# Patient Record
Sex: Female | Born: 1935 | Race: White | Marital: Married | Smoking: Never smoker
Health system: Southern US, Community
[De-identification: ages and names within clinical notes are randomized; demographics above are authoritative.]

## PROBLEM LIST (undated history)

## (undated) DIAGNOSIS — F0390 Unspecified dementia without behavioral disturbance: Secondary | ICD-10-CM

## (undated) DIAGNOSIS — E079 Disorder of thyroid, unspecified: Secondary | ICD-10-CM

---

## 2015-02-25 ENCOUNTER — Other Ambulatory Visit: Payer: Self-pay | Admitting: Family Medicine

## 2015-02-25 DIAGNOSIS — R413 Other amnesia: Secondary | ICD-10-CM

## 2015-02-25 DIAGNOSIS — I10 Essential (primary) hypertension: Secondary | ICD-10-CM

## 2015-03-02 ENCOUNTER — Other Ambulatory Visit: Payer: Self-pay

## 2015-03-09 ENCOUNTER — Other Ambulatory Visit: Payer: Self-pay | Admitting: Family Medicine

## 2015-03-09 ENCOUNTER — Ambulatory Visit
Admission: RE | Admit: 2015-03-09 | Discharge: 2015-03-09 | Disposition: A | Discharge status: Home / Self Care | Payer: Medicare Other | Source: Ambulatory Visit | Attending: Family Medicine | Admitting: Family Medicine

## 2015-03-09 DIAGNOSIS — R413 Other amnesia: Secondary | ICD-10-CM

## 2015-03-09 DIAGNOSIS — I10 Essential (primary) hypertension: Secondary | ICD-10-CM

## 2015-08-10 ENCOUNTER — Other Ambulatory Visit: Payer: Self-pay | Admitting: Family Medicine

## 2015-08-10 DIAGNOSIS — R109 Unspecified abdominal pain: Secondary | ICD-10-CM

## 2015-08-10 DIAGNOSIS — R9389 Abnormal findings on diagnostic imaging of other specified body structures: Secondary | ICD-10-CM

## 2015-08-11 ENCOUNTER — Inpatient Hospital Stay (HOSPITAL_COMMUNITY)
Admission: EM | Admit: 2015-08-11 | Discharge: 2015-08-14 | DRG: 470 | Disposition: A | Discharge status: Skilled Nursing Facility | Payer: Medicare Other | Attending: Internal Medicine | Admitting: Internal Medicine

## 2015-08-11 ENCOUNTER — Encounter (HOSPITAL_COMMUNITY)
Admission: EM | Disposition: A | Discharge status: Skilled Nursing Facility | Payer: Self-pay | Source: Home / Self Care | Attending: Internal Medicine

## 2015-08-11 ENCOUNTER — Encounter (HOSPITAL_COMMUNITY): Payer: Self-pay | Admitting: *Deleted

## 2015-08-11 ENCOUNTER — Emergency Department (HOSPITAL_COMMUNITY): Payer: Medicare Other

## 2015-08-11 ENCOUNTER — Inpatient Hospital Stay (HOSPITAL_COMMUNITY): Payer: Medicare Other | Admitting: Certified Registered Nurse Anesthetist

## 2015-08-11 ENCOUNTER — Inpatient Hospital Stay (HOSPITAL_COMMUNITY): Payer: Medicare Other

## 2015-08-11 DIAGNOSIS — S72002A Fracture of unspecified part of neck of left femur, initial encounter for closed fracture: Secondary | ICD-10-CM | POA: Diagnosis present

## 2015-08-11 DIAGNOSIS — C7951 Secondary malignant neoplasm of bone: Secondary | ICD-10-CM | POA: Diagnosis present

## 2015-08-11 DIAGNOSIS — S72009A Fracture of unspecified part of neck of unspecified femur, initial encounter for closed fracture: Secondary | ICD-10-CM | POA: Diagnosis present

## 2015-08-11 DIAGNOSIS — S72012A Unspecified intracapsular fracture of left femur, initial encounter for closed fracture: Secondary | ICD-10-CM | POA: Diagnosis present

## 2015-08-11 DIAGNOSIS — N179 Acute kidney failure, unspecified: Secondary | ICD-10-CM | POA: Diagnosis present

## 2015-08-11 DIAGNOSIS — Z515 Encounter for palliative care: Secondary | ICD-10-CM | POA: Insufficient documentation

## 2015-08-11 DIAGNOSIS — T68XXXA Hypothermia, initial encounter: Secondary | ICD-10-CM | POA: Diagnosis present

## 2015-08-11 DIAGNOSIS — E871 Hypo-osmolality and hyponatremia: Secondary | ICD-10-CM | POA: Diagnosis present

## 2015-08-11 DIAGNOSIS — F0390 Unspecified dementia without behavioral disturbance: Secondary | ICD-10-CM | POA: Diagnosis not present

## 2015-08-11 DIAGNOSIS — W010XXA Fall on same level from slipping, tripping and stumbling without subsequent striking against object, initial encounter: Secondary | ICD-10-CM | POA: Diagnosis present

## 2015-08-11 DIAGNOSIS — E039 Hypothyroidism, unspecified: Secondary | ICD-10-CM | POA: Diagnosis present

## 2015-08-11 DIAGNOSIS — I10 Essential (primary) hypertension: Secondary | ICD-10-CM

## 2015-08-11 DIAGNOSIS — C799 Secondary malignant neoplasm of unspecified site: Secondary | ICD-10-CM | POA: Diagnosis present

## 2015-08-11 DIAGNOSIS — I129 Hypertensive chronic kidney disease with stage 1 through stage 4 chronic kidney disease, or unspecified chronic kidney disease: Secondary | ICD-10-CM | POA: Diagnosis present

## 2015-08-11 DIAGNOSIS — N183 Chronic kidney disease, stage 3 (moderate): Secondary | ICD-10-CM | POA: Diagnosis present

## 2015-08-11 DIAGNOSIS — R109 Unspecified abdominal pain: Secondary | ICD-10-CM | POA: Diagnosis present

## 2015-08-11 DIAGNOSIS — Z66 Do not resuscitate: Secondary | ICD-10-CM | POA: Diagnosis present

## 2015-08-11 DIAGNOSIS — N390 Urinary tract infection, site not specified: Secondary | ICD-10-CM | POA: Diagnosis present

## 2015-08-11 DIAGNOSIS — C7801 Secondary malignant neoplasm of right lung: Secondary | ICD-10-CM | POA: Diagnosis present

## 2015-08-11 DIAGNOSIS — C7802 Secondary malignant neoplasm of left lung: Secondary | ICD-10-CM | POA: Diagnosis present

## 2015-08-11 DIAGNOSIS — Y92193 Bedroom in other specified residential institution as the place of occurrence of the external cause: Secondary | ICD-10-CM | POA: Diagnosis not present

## 2015-08-11 DIAGNOSIS — C801 Malignant (primary) neoplasm, unspecified: Secondary | ICD-10-CM | POA: Diagnosis not present

## 2015-08-11 HISTORY — DX: Disorder of thyroid, unspecified: E07.9

## 2015-08-11 HISTORY — PX: ANTERIOR APPROACH HEMI HIP ARTHROPLASTY: SHX6690

## 2015-08-11 HISTORY — DX: Unspecified dementia, unspecified severity, without behavioral disturbance, psychotic disturbance, mood disturbance, and anxiety: F03.90

## 2015-08-11 LAB — CBC WITH DIFFERENTIAL/PLATELET
BASOS PCT: 0 %
Basophils Absolute: 0 10*3/uL (ref 0.0–0.1)
EOS ABS: 0 10*3/uL (ref 0.0–0.7)
Eosinophils Relative: 0 %
HCT: 35.5 % — ABNORMAL LOW (ref 36.0–46.0)
Hemoglobin: 12 g/dL (ref 12.0–15.0)
Lymphocytes Relative: 4 %
Lymphs Abs: 0.4 10*3/uL — ABNORMAL LOW (ref 0.7–4.0)
MCH: 30.2 pg (ref 26.0–34.0)
MCHC: 33.8 g/dL (ref 30.0–36.0)
MCV: 89.4 fL (ref 78.0–100.0)
MONO ABS: 0.6 10*3/uL (ref 0.1–1.0)
MONOS PCT: 6 %
Neutro Abs: 9.4 10*3/uL — ABNORMAL HIGH (ref 1.7–7.7)
Neutrophils Relative %: 90 %
PLATELETS: 327 10*3/uL (ref 150–400)
RBC: 3.97 MIL/uL (ref 3.87–5.11)
RDW: 11.9 % (ref 11.5–15.5)
WBC: 10.4 10*3/uL (ref 4.0–10.5)

## 2015-08-11 LAB — BASIC METABOLIC PANEL
Anion gap: 10 (ref 5–15)
BUN: 42 mg/dL — ABNORMAL HIGH (ref 6–20)
CALCIUM: 9.8 mg/dL (ref 8.9–10.3)
CO2: 21 mmol/L — AB (ref 22–32)
CREATININE: 1.78 mg/dL — AB (ref 0.44–1.00)
Chloride: 99 mmol/L — ABNORMAL LOW (ref 101–111)
GFR, EST AFRICAN AMERICAN: 29 mL/min — AB (ref >60)
GFR, EST NON AFRICAN AMERICAN: 25 mL/min — AB (ref >60)
GLUCOSE: 165 mg/dL — AB (ref 65–99)
Potassium: 4.5 mmol/L (ref 3.5–5.1)
Sodium: 130 mmol/L — ABNORMAL LOW (ref 135–145)

## 2015-08-11 LAB — URINALYSIS, ROUTINE W REFLEX MICROSCOPIC
Bilirubin Urine: NEGATIVE
GLUCOSE, UA: NEGATIVE mg/dL
Ketones, ur: 15 mg/dL — AB
Nitrite: NEGATIVE
PH: 6 (ref 5.0–8.0)
Protein, ur: NEGATIVE mg/dL
SPECIFIC GRAVITY, URINE: 1.014 (ref 1.005–1.030)

## 2015-08-11 LAB — ABO/RH: ABO/RH(D): O POS

## 2015-08-11 LAB — URINE MICROSCOPIC-ADD ON

## 2015-08-11 LAB — PROTIME-INR
INR: 1.11 (ref 0.00–1.49)
PROTHROMBIN TIME: 14.5 s (ref 11.6–15.2)

## 2015-08-11 LAB — TROPONIN I: Troponin I: <0.03 ng/mL (ref <0.031)

## 2015-08-11 LAB — CK: CK TOTAL: 288 U/L — AB (ref 38–234)

## 2015-08-11 LAB — TYPE AND SCREEN
ABO/RH(D): O POS
Antibody Screen: NEGATIVE

## 2015-08-11 LAB — SURGICAL PCR SCREEN
MRSA, PCR: NEGATIVE
STAPHYLOCOCCUS AUREUS: NEGATIVE

## 2015-08-11 SURGERY — Surgical Case
Anesthesia: *Unknown

## 2015-08-11 SURGERY — HEMIARTHROPLASTY, HIP, DIRECT ANTERIOR APPROACH, FOR FRACTURE
Anesthesia: General | Site: Hip | Laterality: Left

## 2015-08-11 MED ORDER — LIDOCAINE HCL (CARDIAC) 20 MG/ML IV SOLN
INTRAVENOUS | Status: AC
  Filled 2015-08-11: qty 5

## 2015-08-11 MED ORDER — SODIUM CHLORIDE 0.9 % IV SOLN
Freq: Once | INTRAVENOUS | Status: AC
  Administered 2015-08-11: 13:00:00 via INTRAVENOUS

## 2015-08-11 MED ORDER — BUPIVACAINE HCL (PF) 0.5 % IJ SOLN
INTRAMUSCULAR | Status: AC
  Filled 2015-08-11: qty 30

## 2015-08-11 MED ORDER — SUCCINYLCHOLINE CHLORIDE 20 MG/ML IJ SOLN
INTRAMUSCULAR | Status: AC
  Filled 2015-08-11: qty 1

## 2015-08-11 MED ORDER — EPHEDRINE SULFATE 50 MG/ML IJ SOLN
INTRAMUSCULAR | Status: DC | PRN
  Administered 2015-08-11: 10 mg via INTRAVENOUS

## 2015-08-11 MED ORDER — LEVOTHYROXINE SODIUM 112 MCG PO TABS
112.0000 ug | ORAL_TABLET | Freq: Every day | ORAL | Status: DC
  Administered 2015-08-12 – 2015-08-14 (×3): 112 ug via ORAL
  Filled 2015-08-11 (×4): qty 1

## 2015-08-11 MED ORDER — MORPHINE SULFATE (PF) 2 MG/ML IV SOLN
0.5000 mg | INTRAVENOUS | Status: DC | PRN
  Administered 2015-08-11: 0.5 mg via INTRAVENOUS
  Filled 2015-08-11: qty 1

## 2015-08-11 MED ORDER — ONDANSETRON HCL 4 MG/2ML IJ SOLN
INTRAMUSCULAR | Status: AC
  Filled 2015-08-11: qty 2

## 2015-08-11 MED ORDER — TRANEXAMIC ACID 1000 MG/10ML IV SOLN
1000.0000 mg | INTRAVENOUS | Status: AC
  Administered 2015-08-11: 1000 mg via INTRAVENOUS
  Filled 2015-08-11: qty 10

## 2015-08-11 MED ORDER — PHENYLEPHRINE HCL 10 MG/ML IJ SOLN
INTRAMUSCULAR | Status: DC | PRN
  Administered 2015-08-11 (×5): 80 ug via INTRAVENOUS

## 2015-08-11 MED ORDER — 0.9 % SODIUM CHLORIDE (POUR BTL) OPTIME
TOPICAL | Status: DC | PRN
  Administered 2015-08-11: 1000 mL

## 2015-08-11 MED ORDER — ONDANSETRON HCL 4 MG/2ML IJ SOLN
INTRAMUSCULAR | Status: DC | PRN
  Administered 2015-08-11: 4 mg via INTRAVENOUS

## 2015-08-11 MED ORDER — LIDOCAINE HCL (CARDIAC) 20 MG/ML IV SOLN
INTRAVENOUS | Status: DC | PRN
  Administered 2015-08-11: 60 mg via INTRAVENOUS

## 2015-08-11 MED ORDER — BUPIVACAINE HCL 0.5 % IJ SOLN
INTRAMUSCULAR | Status: DC | PRN
  Administered 2015-08-11: 20 mL

## 2015-08-11 MED ORDER — CEFAZOLIN SODIUM-DEXTROSE 2-3 GM-% IV SOLR
INTRAVENOUS | Status: DC | PRN
  Administered 2015-08-11: 2 g via INTRAVENOUS

## 2015-08-11 MED ORDER — ROCURONIUM BROMIDE 50 MG/5ML IV SOLN
INTRAVENOUS | Status: AC
  Filled 2015-08-11: qty 1

## 2015-08-11 MED ORDER — DEXAMETHASONE SODIUM PHOSPHATE 4 MG/ML IJ SOLN
INTRAMUSCULAR | Status: DC | PRN
  Administered 2015-08-11: 4 mg via INTRAVENOUS

## 2015-08-11 MED ORDER — DEXAMETHASONE SODIUM PHOSPHATE 4 MG/ML IJ SOLN
INTRAMUSCULAR | Status: AC
  Filled 2015-08-11: qty 1

## 2015-08-11 MED ORDER — TRAMADOL HCL 50 MG PO TABS: 50.0000 mg | ORAL_TABLET | Freq: Four times a day (QID) | ORAL | Status: AC | PRN

## 2015-08-11 MED ORDER — LACTATED RINGERS IV SOLN
INTRAVENOUS | Status: DC | PRN
  Administered 2015-08-11 (×2): via INTRAVENOUS

## 2015-08-11 MED ORDER — BISACODYL 10 MG RE SUPP: 10.0000 mg | Freq: Every day | RECTAL | Status: DC | PRN

## 2015-08-11 MED ORDER — SODIUM CHLORIDE 0.9 % IV SOLN
20.0000 mL | INTRAVENOUS | Status: DC
  Administered 2015-08-11: 1000 mL via INTRAVENOUS

## 2015-08-11 MED ORDER — SUCCINYLCHOLINE CHLORIDE 20 MG/ML IJ SOLN
INTRAMUSCULAR | Status: DC | PRN
  Administered 2015-08-11: 80 mg via INTRAVENOUS

## 2015-08-11 MED ORDER — ONDANSETRON HCL 4 MG/2ML IJ SOLN: 4.0000 mg | Freq: Three times a day (TID) | INTRAMUSCULAR | Status: AC | PRN

## 2015-08-11 MED ORDER — PROPOFOL 10 MG/ML IV BOLUS
INTRAVENOUS | Status: DC | PRN
  Administered 2015-08-11: 100 mg via INTRAVENOUS

## 2015-08-11 MED ORDER — BUPIVACAINE LIPOSOME 1.3 % IJ SUSP
INTRAMUSCULAR | Status: DC | PRN
  Administered 2015-08-11: 20 mL

## 2015-08-11 MED ORDER — SODIUM CHLORIDE 0.9 % IJ SOLN
INTRAMUSCULAR | Status: AC
  Filled 2015-08-11: qty 10

## 2015-08-11 MED ORDER — BUPIVACAINE LIPOSOME 1.3 % IJ SUSP
20.0000 mL | INTRAMUSCULAR | Status: DC
  Filled 2015-08-11: qty 20

## 2015-08-11 MED ORDER — PROPOFOL 10 MG/ML IV BOLUS
INTRAVENOUS | Status: AC
  Filled 2015-08-11: qty 20

## 2015-08-11 MED ORDER — SUFENTANIL CITRATE 50 MCG/ML IV SOLN
INTRAVENOUS | Status: DC | PRN
  Administered 2015-08-11 (×3): 5 ug via INTRAVENOUS

## 2015-08-11 MED ORDER — ASPIRIN EC 325 MG PO TBEC: 325.0000 mg | DELAYED_RELEASE_TABLET | Freq: Two times a day (BID) | ORAL | Status: DC

## 2015-08-11 MED ORDER — NALOXONE HCL 0.4 MG/ML IJ SOLN
INTRAMUSCULAR | Status: AC
  Filled 2015-08-11: qty 1

## 2015-08-11 MED ORDER — HYDROCODONE-ACETAMINOPHEN 5-325 MG PO TABS: 1.0000 | ORAL_TABLET | ORAL | Status: DC | PRN

## 2015-08-11 MED ORDER — SUFENTANIL CITRATE 50 MCG/ML IV SOLN
INTRAVENOUS | Status: AC
  Filled 2015-08-11: qty 1

## 2015-08-11 SURGICAL SUPPLY — 57 items
BENZOIN TINCTURE PRP APPL 2/3 (GAUZE/BANDAGES/DRESSINGS) ×2 IMPLANT
BLADE SAW SGTL 18X1.27X75 (BLADE) ×2 IMPLANT
BLADE SURG ROTATE 9660 (MISCELLANEOUS) IMPLANT
BNDG COHESIVE 6X5 TAN STRL LF (GAUZE/BANDAGES/DRESSINGS) IMPLANT
BNDG GAUZE ELAST 4 BULKY (GAUZE/BANDAGES/DRESSINGS) IMPLANT
CAPT HIP HEMI 1 ×2 IMPLANT
CELLS DAT CNTRL 66122 CELL SVR (MISCELLANEOUS) IMPLANT
CLSR STERI-STRIP ANTIMIC 1/2X4 (GAUZE/BANDAGES/DRESSINGS) IMPLANT
COVER PERINEAL POST (MISCELLANEOUS) ×2 IMPLANT
COVER SURGICAL LIGHT HANDLE (MISCELLANEOUS) ×2 IMPLANT
DRAPE C-ARM 42X72 X-RAY (DRAPES) ×2 IMPLANT
DRAPE STERI IOBAN 125X83 (DRAPES) ×2 IMPLANT
DRAPE U-SHAPE 47X51 STRL (DRAPES) ×4 IMPLANT
DRSG AQUACEL AG ADV 3.5X10 (GAUZE/BANDAGES/DRESSINGS) ×2 IMPLANT
DRSG MEPILEX BORDER 4X8 (GAUZE/BANDAGES/DRESSINGS) ×2 IMPLANT
DURAPREP 26ML APPLICATOR (WOUND CARE) ×2 IMPLANT
ELECT BLADE 4.0 EZ CLEAN MEGAD (MISCELLANEOUS)
ELECT CAUTERY BLADE 6.4 (BLADE) ×2 IMPLANT
ELECT REM PT RETURN 9FT ADLT (ELECTROSURGICAL) ×2
ELECTRODE BLDE 4.0 EZ CLN MEGD (MISCELLANEOUS) IMPLANT
ELECTRODE REM PT RTRN 9FT ADLT (ELECTROSURGICAL) ×1 IMPLANT
GAUZE XEROFORM 1X8 LF (GAUZE/BANDAGES/DRESSINGS) ×2 IMPLANT
GLOVE BIOGEL PI IND STRL 8 (GLOVE) ×2 IMPLANT
GLOVE BIOGEL PI INDICATOR 8 (GLOVE) ×2
GLOVE ECLIPSE 7.5 STRL STRAW (GLOVE) ×4 IMPLANT
GOWN STRL REUS W/ TWL LRG LVL3 (GOWN DISPOSABLE) ×2 IMPLANT
GOWN STRL REUS W/ TWL XL LVL3 (GOWN DISPOSABLE) ×2 IMPLANT
GOWN STRL REUS W/TWL LRG LVL3 (GOWN DISPOSABLE) ×2
GOWN STRL REUS W/TWL XL LVL3 (GOWN DISPOSABLE) ×2
HOOD PEEL AWAY FACE SHEILD DIS (HOOD) ×4 IMPLANT
KIT BASIN OR (CUSTOM PROCEDURE TRAY) ×2 IMPLANT
KIT ROOM TURNOVER OR (KITS) ×2 IMPLANT
MANIFOLD NEPTUNE II (INSTRUMENTS) ×2 IMPLANT
NEEDLE SPNL 22GX3.5 QUINCKE BK (NEEDLE) ×2 IMPLANT
NS IRRIG 1000ML POUR BTL (IV SOLUTION) ×2 IMPLANT
PACK TOTAL JOINT (CUSTOM PROCEDURE TRAY) ×2 IMPLANT
PACK UNIVERSAL I (CUSTOM PROCEDURE TRAY) ×2 IMPLANT
PAD ARMBOARD 7.5X6 YLW CONV (MISCELLANEOUS) ×4 IMPLANT
RTRCTR WOUND ALEXIS 18CM MED (MISCELLANEOUS)
RTRCTR WOUND ALEXIS 18CM SML (INSTRUMENTS) ×2
SAVER CELL AAL HAEMONETICS (INSTRUMENTS) ×1 IMPLANT
SPONGE LAP 18X18 X RAY DECT (DISPOSABLE) IMPLANT
STAPLER VISISTAT 35W (STAPLE) IMPLANT
SUT ETHIBOND NAB CT1 #1 30IN (SUTURE) ×4 IMPLANT
SUT MNCRL AB 3-0 PS2 18 (SUTURE) IMPLANT
SUT VIC AB 0 CT1 27 (SUTURE) ×2
SUT VIC AB 0 CT1 27XBRD ANBCTR (SUTURE) ×2 IMPLANT
SUT VIC AB 1 CT1 27 (SUTURE) ×3
SUT VIC AB 1 CT1 27XBRD ANBCTR (SUTURE) ×3 IMPLANT
SUT VIC AB 2-0 CT1 27 (SUTURE) ×1
SUT VIC AB 2-0 CT1 TAPERPNT 27 (SUTURE) ×1 IMPLANT
SYR 50ML LL SCALE MARK (SYRINGE) ×2 IMPLANT
TOWEL OR 17X24 6PK STRL BLUE (TOWEL DISPOSABLE) ×2 IMPLANT
TOWEL OR 17X26 10 PK STRL BLUE (TOWEL DISPOSABLE) ×2 IMPLANT
TRAY CATH 16FR W/PLASTIC CATH (SET/KITS/TRAYS/PACK) IMPLANT
TRAY FOLEY CATH 16FR SILVER (SET/KITS/TRAYS/PACK) IMPLANT
WATER STERILE IRR 1000ML POUR (IV SOLUTION) ×4 IMPLANT

## 2015-08-11 NOTE — ED Notes (Signed)
Pt arrives from [place] [facility] via [facility]. Pt fell sometime last night and was unable to get up. Pt son attempted to call her today and when he couldn't reach her he went to her house and found her laying on the floor. Pt has a hx of dementia and states she doesn't remember falling. Pt left leg is shortened and rotated upon arrival. Pt received 100 mcg of fentanyl and 4mg  of zofran PTA.

## 2015-08-11 NOTE — ED Provider Notes (Signed)
CSN: [id number]     Arrival date & time 08/11/15  [id number] History   First MD Initiated Contact with Patient 08/11/15 [phone]     Chief Complaint  Patient presents with  . Fall     (Consider location/radiation/quality/duration/timing/severity/associated sxs/prior Treatment) HPI Comments: Patient presents via EMS. She fell sometime overnight and was unable to get up. She was found on the floor by her son this morning. She does not remember falling. He complains of pain to her left hip with deformity. Denies head or neck injury. Some history of dementia. Currently being treated for UTI. No blood thinner use. She denies head, neck, back, chest or abdominal pain. Complains to pain to left hip. No focal weakness, numbness or tingling. No bowel or bladder incontinence.  The history is provided by the patient and the EMS personnel. The history is limited by the condition of the patient.    Past Medical History  Diagnosis Date  . Thyroid disease   . Dementia    History reviewed. No pertinent past surgical history. History reviewed. No pertinent family history. Social History  Substance Use Topics  . Smoking status: Never Smoker   . Smokeless tobacco: None  . Alcohol Use: No   OB History    No data available     Review of Systems  Constitutional: Negative for fever, activity change and appetite change.  Respiratory: Negative for cough, chest tightness and shortness of breath.   Cardiovascular: Negative for chest pain.  Gastrointestinal: Negative for nausea, vomiting and abdominal pain.  Genitourinary: Negative for dysuria and hematuria.  Musculoskeletal: Positive for myalgias and arthralgias. Negative for back pain.  Skin: Negative for wound.  Neurological: Negative for dizziness, weakness and headaches.  A complete 10 system review of systems was obtained and all systems are negative except as noted in the HPI and PMH.      Allergies  Review of patient's allergies indicates no known  allergies.  Home Medications   Prior to Admission medications   Medication Sig Start Date End Date Taking? Authorizing Provider  ciprofloxacin (CIPRO) 500 MG tablet Take 500 mg by mouth 2 (two) times daily. For 10 days 08/10/15 08/20/15 Yes Historical Provider, MD  irbesartan (AVAPRO) 75 MG tablet Take 75 mg by mouth daily.   Yes Historical Provider, MD  levothyroxine (SYNTHROID, LEVOTHROID) 112 MCG tablet Take 112 mcg by mouth daily before breakfast.   Yes Historical Provider, MD  vitamin B-12 (CYANOCOBALAMIN) 100 MCG tablet Take 100 mcg by mouth daily.   Yes Historical Provider, MD  vitamin E 100 UNIT capsule Take 100 Units by mouth daily.   Yes Historical Provider, MD   BP 110/74 mmHg  Pulse 96  Temp(Src) 98.4 F (36.9 C) (Rectal)  Resp 15  SpO2 95% Physical Exam  Constitutional: She is oriented to person, place, and time. She appears well-developed and well-nourished. No distress.  HENT:  Head: Normocephalic and atraumatic.  Mouth/Throat: Oropharynx is clear and moist. No oropharyngeal exudate.  Eyes: Conjunctivae and EOM are normal. Pupils are equal, round, and reactive to light.  Neck: Normal range of motion. Neck supple.  No C spine tenderness  Cardiovascular: Normal rate, regular rhythm, normal heart sounds and intact distal pulses.   No murmur heard. Pulmonary/Chest: Effort normal and breath sounds normal. No respiratory distress.  Abdominal: Soft. There is no tenderness. There is no rebound and no guarding.  Musculoskeletal: Normal range of motion. She exhibits tenderness. She exhibits no edema.  L leg shortened and externally rotated.  TTP L lateral hip. Intact DP and PT pulses  Neurological: She is alert and oriented to person, place, and time. No cranial nerve deficit. She exhibits normal muscle tone. Coordination normal.  No ataxia on finger to nose bilaterally. No pronator drift. 5/5 strength throughout. CN 2-12 intact.Equal grip strength. Sensation intact.   Skin: Skin  is warm.  Psychiatric: She has a normal mood and affect. Her behavior is normal.  Nursing note and vitals reviewed.   ED Course  Procedures (including critical care time) Labs Review Labs Reviewed  BASIC METABOLIC PANEL - Abnormal; Notable for the following:    Sodium 130 (*)    Chloride 99 (*)    CO2 21 (*)    Glucose, Bld 165 (*)    BUN 42 (*)    Creatinine, Ser 1.78 (*)    GFR calc non Af Amer 25 (*)    GFR calc Af Amer 29 (*)    All other components within normal limits  CBC WITH DIFFERENTIAL/PLATELET - Abnormal; Notable for the following:    HCT 35.5 (*)    Neutro Abs 9.4 (*)    Lymphs Abs 0.4 (*)    All other components within normal limits  URINALYSIS, ROUTINE W REFLEX MICROSCOPIC (NOT AT [facility]) - Abnormal; Notable for the following:    Hgb urine dipstick LARGE (*)    Ketones, ur 15 (*)    Leukocytes, UA TRACE (*)    All other components within normal limits  CK - Abnormal; Notable for the following:    Total CK 288 (*)    All other components within normal limits  URINE MICROSCOPIC-ADD ON - Abnormal; Notable for the following:    Squamous Epithelial / LPF 0-5 (*)    Bacteria, UA FEW (*)    All other components within normal limits  PROTIME-INR  TROPONIN I  TYPE AND SCREEN  ABO/RH    Imaging Review Dg Chest 1 View  08/11/2015  CLINICAL DATA:  Fall EXAM: CHEST 1 VIEW COMPARISON:  None. FINDINGS: Numerous lung nodules are present throughout both lungs. Most of the nodules are under 1 cm. Extrapleural mass in the right upper lobe related to a rib lesion with adjacent soft tissue mass. Heart size normal. Negative for heart failure. Negative for pneumonia or effusion. IMPRESSION: Widespread lung nodules consistent with metastatic disease. Rib lesion right third rib with adjacent soft tissue extrapleural mass. This is consistent with metastatic disease. Electronically Signed   By: Franchot Gallo M.D.   On: 08/11/2015 11:43   Ct Head Wo Contrast  08/11/2015  CLINICAL  DATA:  Fall last night.  Found on floor EXAM: CT HEAD WITHOUT CONTRAST CT CERVICAL SPINE WITHOUT CONTRAST TECHNIQUE: Multidetector CT imaging of the head and cervical spine was performed following the standard protocol without intravenous contrast. Multiplanar CT image reconstructions of the cervical spine were also generated. COMPARISON:  03/09/2015 FINDINGS: CT HEAD FINDINGS Generalized atrophy with mild chronic microvascular ischemic change in the white matter. Negative for acute infarct.  Negative for hemorrhage or mass Negative for skull fracture. CT CERVICAL SPINE FINDINGS Mild anterior slip C4-5 with disc and facet degeneration. Moderate disc degeneration and spondylosis C5-6 and C6-7. Negative for fracture.  No bony mass lesion. Carotid artery calcification. Numerous lung nodules bilaterally, concerning for metastatic disease. IMPRESSION: Atrophy and chronic microvascular ischemia. No acute intracranial abnormality Cervical spine degenerative change.  Negative for fracture Numerous lung nodules, consistent with metastatic disease. Electronically Signed   By: Franchot Gallo M.D.  On: 08/11/2015 11:27   Ct Cervical Spine Wo Contrast  08/11/2015  CLINICAL DATA:  Fall last night.  Found on floor EXAM: CT HEAD WITHOUT CONTRAST CT CERVICAL SPINE WITHOUT CONTRAST TECHNIQUE: Multidetector CT imaging of the head and cervical spine was performed following the standard protocol without intravenous contrast. Multiplanar CT image reconstructions of the cervical spine were also generated. COMPARISON:  03/09/2015 FINDINGS: CT HEAD FINDINGS Generalized atrophy with mild chronic microvascular ischemic change in the white matter. Negative for acute infarct.  Negative for hemorrhage or mass Negative for skull fracture. CT CERVICAL SPINE FINDINGS Mild anterior slip C4-5 with disc and facet degeneration. Moderate disc degeneration and spondylosis C5-6 and C6-7. Negative for fracture.  No bony mass lesion. Carotid artery  calcification. Numerous lung nodules bilaterally, concerning for metastatic disease. IMPRESSION: Atrophy and chronic microvascular ischemia. No acute intracranial abnormality Cervical spine degenerative change.  Negative for fracture Numerous lung nodules, consistent with metastatic disease. Electronically Signed   By: Franchot Gallo M.D.   On: 08/11/2015 11:27   Dg Hip Unilat With Pelvis 2-3 Views Left  08/11/2015  CLINICAL DATA:  Pain following fall EXAM: DG HIP (WITH OR WITHOUT PELVIS) 2-3V LEFT COMPARISON:  None. FINDINGS: Frontal pelvis as well as frontal and lateral left hip images were obtained. There is a subcapital femoral neck fracture on the left with varus angulation at fracture site. There is mild superior migration of the left femoral shaft. No other fractures. No dislocation. There is mild symmetric narrowing of both hip joints. Bones appear somewhat osteoporotic. IMPRESSION: Subcapital femoral neck fracture on the left with superior migration of the femoral shaft and varus angulation of the fracture site. No dislocation. Bones osteoporotic. Mild symmetric narrowing both hip joints. Electronically Signed   By: Lowella Grip III M.D.   On: 08/11/2015 11:47   I have personally reviewed and evaluated these images and lab results as part of my medical decision-making.   EKG Interpretation None      MDM   Final diagnoses:  Femoral neck fracture, left, closed, initial encounter  Metastatic disease ([place])   Presumed mechanical fall with probable hip fracture. Neurovascularly intact. No apparent head or neck injury.  CT head   and C-spine negative. Hip x-ray shows left subcapital femoral neck fracture.  Labs showed mild hyponatremia, elevated creatinine. IV fluids will be started.  Discussed with Dr. Berenice Primas orthopedics. He will plan on surgical repair patient's left hip later today. Patient with incidental likely metastatic disease on chest x-ray. Her family was informed of this  and they state that she recently had an outpatient x-ray that showed the same and was scheduled to have CT scans by her PCP. Admission d.w Dr. Tyrell Antonio.     Ezequiel Essex, MD 08/11/15 [phone]

## 2015-08-11 NOTE — Progress Notes (Addendum)
Admission note:  Arrival Method: Stretcher from ED Mental Orientation: A&OX2 Telemetry: N/A Assessment: See flowsheet Skin: Dry;intact IV: L A/C Pain: Moaning; facial grimacing indicated pain Tubes: Foley catheter Safety Measures: Bed in lowest position, call light within reach, fall risk arm band & yellow socks placed, bed alamr activated. Fall Prevention Safety Plan: Reviewed with family Admission Screening: Completed 6700 Orientation: Patient has been oriented to the unit, staff and to the room.  Orders have been reviewed and implemented. Will continue to assess and monitor pt.   Gavin Potters

## 2015-08-11 NOTE — ED Notes (Signed)
Admitting at bedside 

## 2015-08-11 NOTE — Consult Note (Signed)
Reason for Consult:Fall with a fractured hip. Referring Physician: Hospitalists  Bristol Osentoski is an 81 y.o. female.  HPI: Patient is 72 shared all female who lives alone.  She does have some mild dementia.  She fell yesterday and suffered a hip fracture.  She was found this morning and brought to the emergency room.  X-rays in the emergency room showed displaced subcapital femoral neck fracture.  We are consult it for evaluation and management.  Past Medical History  Diagnosis Date  . Thyroid disease   . Dementia     History reviewed. No pertinent past surgical history.  History reviewed. No pertinent family history.  Social History:  reports that she has never smoked. She does not have any smokeless tobacco history on file. She reports that she does not drink alcohol or use illicit drugs.  Allergies: No Known Allergies  Medications: I have reviewed the patient's current medications.  Results for orders placed or performed during the hospital encounter of 08/11/15 (from the past 48 hour(s))  Basic metabolic panel     Status: Abnormal   Collection Time: 08/11/15  9:36 AM  Result Value Ref Range   Sodium 130 (L) 135 - 145 mmol/L   Potassium 4.5 3.5 - 5.1 mmol/L   Chloride 99 (L) 101 - 111 mmol/L   CO2 21 (L) 22 - 32 mmol/L   Glucose, Bld 165 (H) 65 - 99 mg/dL   BUN 42 (H) 6 - 20 mg/dL   Creatinine, Ser 1.78 (H) 0.44 - 1.00 mg/dL   Calcium 9.8 8.9 - 10.3 mg/dL   GFR calc non Af Amer 25 (L) >60 mL/min   GFR calc Af Amer 29 (L) >60 mL/min    Comment: (NOTE) The eGFR has been calculated using the CKD EPI equation. This calculation has not been validated in all clinical situations. eGFR's persistently <60 mL/min signify possible Chronic Kidney Disease.    Anion gap 10 5 - 15  CBC WITH DIFFERENTIAL     Status: Abnormal   Collection Time: 08/11/15  9:36 AM  Result Value Ref Range   WBC 10.4 4.0 - 10.5 K/uL   RBC 3.97 3.87 - 5.11 MIL/uL   Hemoglobin 12.0 12.0 - 15.0 g/dL    HCT 35.5 (L) 36.0 - 46.0 %   MCV 89.4 78.0 - 100.0 fL   MCH 30.2 26.0 - 34.0 pg   MCHC 33.8 30.0 - 36.0 g/dL   RDW 11.9 11.5 - 15.5 %   Platelets 327 150 - 400 K/uL   Neutrophils Relative % 90 %   Neutro Abs 9.4 (H) 1.7 - 7.7 K/uL   Lymphocytes Relative 4 %   Lymphs Abs 0.4 (L) 0.7 - 4.0 K/uL   Monocytes Relative 6 %   Monocytes Absolute 0.6 0.1 - 1.0 K/uL   Eosinophils Relative 0 %   Eosinophils Absolute 0.0 0.0 - 0.7 K/uL   Basophils Relative 0 %   Basophils Absolute 0.0 0.0 - 0.1 K/uL  Protime-INR     Status: None   Collection Time: 08/11/15  9:36 AM  Result Value Ref Range   Prothrombin Time 14.5 11.6 - 15.2 seconds   INR 1.11 0.00 - 1.49  CK     Status: Abnormal   Collection Time: 08/11/15  9:36 AM  Result Value Ref Range   Total CK 288 (H) 38 - 234 U/L  Troponin I     Status: None   Collection Time: 08/11/15  9:36 AM  Result Value Ref Range  Troponin I <0.03 <0.031 ng/mL    Comment:        NO INDICATION OF MYOCARDIAL INJURY.   Type and screen [place]     Status: None   Collection Time: 08/11/15 10:00 AM  Result Value Ref Range   ABO/RH(D) O POS    Antibody Screen NEG    Sample Expiration 08/14/2015   ABO/Rh     Status: None   Collection Time: 08/11/15 10:00 AM  Result Value Ref Range   ABO/RH(D) O POS   Urinalysis, Routine w reflex microscopic (not at [facility])     Status: Abnormal   Collection Time: 08/11/15 10:25 AM  Result Value Ref Range   Color, Urine YELLOW YELLOW   APPearance CLEAR CLEAR   Specific Gravity, Urine 1.014 1.005 - 1.030   pH 6.0 5.0 - 8.0   Glucose, UA NEGATIVE NEGATIVE mg/dL   Hgb urine dipstick LARGE (A) NEGATIVE   Bilirubin Urine NEGATIVE NEGATIVE   Ketones, ur 15 (A) NEGATIVE mg/dL   Protein, ur NEGATIVE NEGATIVE mg/dL   Nitrite NEGATIVE NEGATIVE   Leukocytes, UA TRACE (A) NEGATIVE  Urine microscopic-add on     Status: Abnormal   Collection Time: 08/11/15 10:25 AM  Result Value Ref Range   Squamous Epithelial  / LPF 0-5 (A) NONE SEEN    Comment: Please note change in reference range.   WBC, UA 0-5 0 - 5 WBC/hpf    Comment: Please note change in reference range.   RBC / HPF 6-30 0 - 5 RBC/hpf    Comment: Please note change in reference range.   Bacteria, UA FEW (A) NONE SEEN    Comment: Please note change in reference range.   Urine-Other AMORPHOUS URATES/PHOSPHATES     Dg Chest 1 View  08/11/2015  CLINICAL DATA:  Fall EXAM: CHEST 1 VIEW COMPARISON:  None. FINDINGS: Numerous lung nodules are present throughout both lungs. Most of the nodules are under 1 cm. Extrapleural mass in the right upper lobe related to a rib lesion with adjacent soft tissue mass. Heart size normal. Negative for heart failure. Negative for pneumonia or effusion. IMPRESSION: Widespread lung nodules consistent with metastatic disease. Rib lesion right third rib with adjacent soft tissue extrapleural mass. This is consistent with metastatic disease. Electronically Signed   By: Franchot Gallo M.D.   On: 08/11/2015 11:43   Ct Head Wo Contrast  08/11/2015  CLINICAL DATA:  Fall last night.  Found on floor EXAM: CT HEAD WITHOUT CONTRAST CT CERVICAL SPINE WITHOUT CONTRAST TECHNIQUE: Multidetector CT imaging of the head and cervical spine was performed following the standard protocol without intravenous contrast. Multiplanar CT image reconstructions of the cervical spine were also generated. COMPARISON:  03/09/2015 FINDINGS: CT HEAD FINDINGS Generalized atrophy with mild chronic microvascular ischemic change in the white matter. Negative for acute infarct.  Negative for hemorrhage or mass Negative for skull fracture. CT CERVICAL SPINE FINDINGS Mild anterior slip C4-5 with disc and facet degeneration. Moderate disc degeneration and spondylosis C5-6 and C6-7. Negative for fracture.  No bony mass lesion. Carotid artery calcification. Numerous lung nodules bilaterally, concerning for metastatic disease. IMPRESSION: Atrophy and chronic  microvascular ischemia. No acute intracranial abnormality Cervical spine degenerative change.  Negative for fracture Numerous lung nodules, consistent with metastatic disease. Electronically Signed   By: Franchot Gallo M.D.   On: 08/11/2015 11:27   Ct Cervical Spine Wo Contrast  08/11/2015  CLINICAL DATA:  Fall last night.  Found on floor EXAM: CT  HEAD WITHOUT CONTRAST CT CERVICAL SPINE WITHOUT CONTRAST TECHNIQUE: Multidetector CT imaging of the head and cervical spine was performed following the standard protocol without intravenous contrast. Multiplanar CT image reconstructions of the cervical spine were also generated. COMPARISON:  03/09/2015 FINDINGS: CT HEAD FINDINGS Generalized atrophy with mild chronic microvascular ischemic change in the white matter. Negative for acute infarct.  Negative for hemorrhage or mass Negative for skull fracture. CT CERVICAL SPINE FINDINGS Mild anterior slip C4-5 with disc and facet degeneration. Moderate disc degeneration and spondylosis C5-6 and C6-7. Negative for fracture.  No bony mass lesion. Carotid artery calcification. Numerous lung nodules bilaterally, concerning for metastatic disease. IMPRESSION: Atrophy and chronic microvascular ischemia. No acute intracranial abnormality Cervical spine degenerative change.  Negative for fracture Numerous lung nodules, consistent with metastatic disease. Electronically Signed   By: Franchot Gallo M.D.   On: 08/11/2015 11:27   Dg Hip Unilat With Pelvis 2-3 Views Left  08/11/2015  CLINICAL DATA:  Pain following fall EXAM: DG HIP (WITH OR WITHOUT PELVIS) 2-3V LEFT COMPARISON:  None. FINDINGS: Frontal pelvis as well as frontal and lateral left hip images were obtained. There is a subcapital femoral neck fracture on the left with varus angulation at fracture site. There is mild superior migration of the left femoral shaft. No other fractures. No dislocation. There is mild symmetric narrowing of both hip joints. Bones appear somewhat  osteoporotic. IMPRESSION: Subcapital femoral neck fracture on the left with superior migration of the femoral shaft and varus angulation of the fracture site. No dislocation. Bones osteoporotic. Mild symmetric narrowing both hip joints. Electronically Signed   By: Lowella Grip III M.D.   On: 08/11/2015 11:47    ROS    ROS: I have reviewed the patient's review of systems thoroughly and there are no positive responses as relates to the HPI. EXAM: Blood pressure 139/70, pulse 79, temperature 97.3 F (36.3 C), temperature source Rectal, resp. rate 22, SpO2 96 %. Physical Exam Well-developed well-nourished patient in no acute distress. Alert and oriented x3 HEENT:within normal limits Cardiac: Regular rate and rhythm Pulmonary: Lungs clear to auscultation Abdomen: Soft and nontender.  Normal active bowel sounds  Musculoskeletal: (Left hip painful range of motion.  External rotation and shortening.  The patient is able to move the foot. Assessment/Plan: 81 year old demented female status post fall onto the left hip with displaced femoral neck fracture.  There is some concern on x-ray about the possibility of metastatic or other disease in the lungs.//The patient will need left hemiarthroplasty as both a palliative and hopefully functional procedure.I have had a prolonged discussion with the patient regarding the risk and benefits of the surgical procedure.  The patient understands the risks include but are not limited to bleeding, infection and failure of the surgery to cure the problem and need for further surgery.  The patient understands there is a slight risk of death at the time of surgery.  The patient understands these risks along with the potential benefits and wishes to proceed with surgical intervention.    The patient will be followed in the office in the postoperative period.  Jalan Fariss L 08/11/2015, 1:58 PM

## 2015-08-11 NOTE — Anesthesia Preprocedure Evaluation (Addendum)
Anesthesia Evaluation  Patient identified by MRN, date of birth, ID band Patient awake    Reviewed: Allergy & Precautions, NPO status , Patient's Chart, lab work & pertinent test results  Airway Mallampati: II  TM Distance: >3 FB Neck ROM: Full    Dental  (+) Lower Dentures   Pulmonary neg pulmonary ROS,    breath sounds clear to auscultation       Cardiovascular hypertension,  Rhythm:Regular Rate:Normal     Neuro/Psych PSYCHIATRIC DISORDERS negative neurological ROS     GI/Hepatic negative GI ROS, Neg liver ROS,   Endo/Other  Hypothyroidism   Renal/GU Renal InsufficiencyRenal disease  negative genitourinary   Musculoskeletal negative musculoskeletal ROS (+)   Abdominal   Peds negative pediatric ROS (+)  Hematology negative hematology ROS (+)   Anesthesia Other Findings   Reproductive/Obstetrics negative OB ROS                            Lab Results  Component Value Date   WBC 10.4 08/11/2015   HGB 12.0 08/11/2015   HCT 35.5* 08/11/2015   MCV 89.4 08/11/2015   PLT 327 08/11/2015   Lab Results  Component Value Date   CREATININE 1.78* 08/11/2015   BUN 42* 08/11/2015   NA 130* 08/11/2015   K 4.5 08/11/2015   CL 99* 08/11/2015   CO2 21* 08/11/2015   Lab Results  Component Value Date   INR 1.11 08/11/2015   EKG: normal sinus rhythm.   Anesthesia Physical Anesthesia Plan  ASA: II and emergent  Anesthesia Plan: General   Post-op Pain Management:    Induction: Intravenous  Airway Management Planned: Oral ETT  Additional Equipment:   Intra-op Plan:   Post-operative Plan: Extubation in OR  Informed Consent: I have reviewed the patients History and Physical, chart, labs and discussed the procedure including the risks, benefits and alternatives for the proposed anesthesia with the patient or authorized representative who has indicated his/her understanding and  acceptance.   Dental advisory given  Plan Discussed with: CRNA  Anesthesia Plan Comments:         Anesthesia Quick Evaluation

## 2015-08-11 NOTE — Brief Op Note (Signed)
08/11/2015  11:25 PM  PATIENT:  Janice Summers  81 y.o. female  PRE-OPERATIVE DIAGNOSIS:  Left hip fracture  POST-OPERATIVE DIAGNOSIS:  Left hip fracture  PROCEDURE:  Procedure(s): ANTERIOR APPROACH HEMI HIP ARTHROPLASTY (Left)  SURGEON:  Surgeon(s) and Role:    * Dorna Leitz, MD - Primary  PHYSICIAN ASSISTANT:   ASSISTANTS: bethune   ANESTHESIA:   general  EBL:  Total I/O In: 1000 [I.V.:1000] Out: 1025 [Urine:825; Blood:200]  BLOOD ADMINISTERED:none  DRAINS: none   LOCAL MEDICATIONS USED:  OTHER experel  SPECIMEN:  No Specimen  DISPOSITION OF SPECIMEN:  N/A  COUNTS:  YES  TOURNIQUET:  * No tourniquets in log *  DICTATION: .Other Dictation: Dictation Number [id number]  PLAN OF CARE: Admit to inpatient   PATIENT DISPOSITION:  PACU - hemodynamically stable.   Delay start of Pharmacological VTE agent (>24hrs) due to surgical blood loss or risk of bleeding: no

## 2015-08-11 NOTE — Anesthesia Procedure Notes (Signed)
Procedure Name: Intubation Date/Time: 08/11/2015 9:54 PM Performed by: Claris Che Pre-anesthesia Checklist: Patient identified, Emergency Drugs available, Suction available, Patient being monitored and Timeout performed Patient Re-evaluated:Patient Re-evaluated prior to inductionOxygen Delivery Method: Circle system utilized Preoxygenation: Pre-oxygenation with 100% oxygen Intubation Type: IV induction, Rapid sequence and Cricoid Pressure applied Ventilation: Mask ventilation without difficulty Laryngoscope Size: Mac and 3 Grade View: Grade III Tube type: Oral Tube size: 8.0 mm Number of attempts: 1 Airway Equipment and Method: Stylet and Oral airway Placement Confirmation: ETT inserted through vocal cords under direct vision and positive ETCO2 Secured at: 22 cm Tube secured with: Tape Dental Injury: Teeth and Oropharynx as per pre-operative assessment

## 2015-08-11 NOTE — H&P (Signed)
[facility] History and Physical  [name] [id number] DOB: 06-24-1927 DOA: 08/11/2015  Referring physician: Emergency Department PCP: Dr. Ernie Hew   CHIEF COMPLAINT:  hip fracture after fall   HPI: Janice Summers is a 81 y.o. female brought to emergency department this morning after a fall at home. Patient's son found her on the floor around 7:30 this morning. Patient has dementia, does not remember any details surrounding the event. Per son, it appeared that patient was trying to get to the bathroom and tripped on her underwear .   Patient developed left flank pain a few days ago. PCP checked urine which apparently contained blood. Cipro was prescribed, patient has had 2 doses so far. No other medical complaints that family is aware of. Outpatient chest x-ray a few days ago suggested metastatic disease, staging CT scans to be ordered by PCP.  Prior to fall, patient walked a couple of miles a day.   ED COURSE:    EDP spoke with orthopedics/Dr. Berenice Primas. Plan is for hip fracture repair this evening  Labs:   Sodium 1:30, BUN 42, creatinine 1.78, glucose 165, CK 288, troponin 0.03  Urinalysis:   Clear, trace leukocytes, negative nitrites,  CXR:   Chest x-ray reveals widespread lung nodules consistent with metastatic disease. There is a right rib lesion (third rib) with adjacent soft tissue extrapleural mass  EKG:    Sinus rhythm Probable left atrial enlargement Minimal ST elevation, inferior leads QT interval 452  Medications  0.9 %  sodium chloride infusion (0 mLs Intravenous Stopped 08/11/15 1231)  HYDROcodone-acetaminophen (NORCO/VICODIN) 5-325 MG per tablet 1-2 tablet (not administered)  ondansetron (ZOFRAN) injection 4 mg (not administered)  0.9 %  sodium chloride infusion ( Intravenous New Bag/Given 08/11/15 1234)    Review of Systems  Unable to perform ROS: dementia   Past Medical History  Diagnosis Date  . Thyroid disease   . Dementia    PSH: none  SOCIAL  HISTORY:  reports that she has never smoked. She does not have any smokeless tobacco history on file. She reports that she does not drink alcohol or use illicit drugs. Lives:  At assisted living facility   Assistive devices:   None needed for ambulation.   No Known Allergies  FMH: mother aneurysm  Prior to Admission medications   Medication Sig Start Date End Date Taking? Authorizing Provider  ciprofloxacin (CIPRO) 500 MG tablet Take 500 mg by mouth 2 (two) times daily. For 10 days 08/10/15 08/20/15 Yes Historical Provider, MD  irbesartan (AVAPRO) 75 MG tablet Take 75 mg by mouth daily.   Yes Historical Provider, MD  levothyroxine (SYNTHROID, LEVOTHROID) 112 MCG tablet Take 112 mcg by mouth daily before breakfast.   Yes Historical Provider, MD  vitamin B-12 (CYANOCOBALAMIN) 100 MCG tablet Take 100 mcg by mouth daily.   Yes Historical Provider, MD  vitamin E 100 UNIT capsule Take 100 Units by mouth daily.   Yes Historical Provider, MD     PHYSICAL EXAM: Filed Vitals:   08/11/15 1230 08/11/15 1300 08/11/15 1315 08/11/15 1330  BP: 139/73 154/67 112/99 139/70  Pulse: 71 74 82 79  Temp: 96.4 F (35.8 C) 96.8 F (36 C) 97.2 F (36.2 C) 97.3 F (36.3 C)  TempSrc:      Resp: 18 22 14 22   SpO2: 97% 93% 94% 96%    General:  Pleasant white female. Appears calm and comfortable Eyes: PER, normal lids, irises & conjunctiva ENT: grossly normal hearing, lips & tongue Neck: no LAD,  no masses Cardiovascular: RRR, no murmurs. No LE edema.  Respiratory: Respirations even and unlabored. Normal respiratory effort. Lungs CTA bilaterally, no wheezes / rales .   Abdomen: soft, non-distended, non-tender, active bowel sounds. No obvious masses.  Skin: no rash seen on limited exam Musculoskeletal: grossly normal tone. Mild internal rotation of LLE Psychiatric: grossly normal mood and affect, speech fluent and appropriate Neurologic: grossly non-focal.         LABS ON ADMISSION:    Basic Metabolic  Panel:  Recent Labs Lab 08/11/15 0936  NA 130*  K 4.5  CL 99*  CO2 21*  GLUCOSE 165*  BUN 42*  CREATININE 1.78*  CALCIUM 9.8    CBC:  Recent Labs Lab 08/11/15 0936  WBC 10.4  NEUTROABS 9.4*  HGB 12.0  HCT 35.5*  MCV 89.4  PLT 327    Radiological Exams on Admission: Dg Chest 1 View  08/11/2015  CLINICAL DATA:  Fall EXAM: CHEST 1 VIEW COMPARISON:  None. FINDINGS: Numerous lung nodules are present throughout both lungs. Most of the nodules are under 1 cm. Extrapleural mass in the right upper lobe related to a rib lesion with adjacent soft tissue mass. Heart size normal. Negative for heart failure. Negative for pneumonia or effusion. IMPRESSION: Widespread lung nodules consistent with metastatic disease. Rib lesion right third rib with adjacent soft tissue extrapleural mass. This is consistent with metastatic disease. Electronically Signed   By: Franchot Gallo M.D.   On: 08/11/2015 11:43   Ct Head Wo Contrast  08/11/2015  CLINICAL DATA:  Fall last night.  Found on floor EXAM: CT HEAD WITHOUT CONTRAST CT CERVICAL SPINE WITHOUT CONTRAST TECHNIQUE: Multidetector CT imaging of the head and cervical spine was performed following the standard protocol without intravenous contrast. Multiplanar CT image reconstructions of the cervical spine were also generated. COMPARISON:  03/09/2015 FINDINGS: CT HEAD FINDINGS Generalized atrophy with mild chronic microvascular ischemic change in the white matter. Negative for acute infarct.  Negative for hemorrhage or mass Negative for skull fracture. CT CERVICAL SPINE FINDINGS Mild anterior slip C4-5 with disc and facet degeneration. Moderate disc degeneration and spondylosis C5-6 and C6-7. Negative for fracture.  No bony mass lesion. Carotid artery calcification. Numerous lung nodules bilaterally, concerning for metastatic disease. IMPRESSION: Atrophy and chronic microvascular ischemia. No acute intracranial abnormality Cervical spine degenerative change.   Negative for fracture Numerous lung nodules, consistent with metastatic disease. Electronically Signed   By: Franchot Gallo M.D.   On: 08/11/2015 11:27   Ct Cervical Spine Wo Contrast  08/11/2015  CLINICAL DATA:  Fall last night.  Found on floor EXAM: CT HEAD WITHOUT CONTRAST CT CERVICAL SPINE WITHOUT CONTRAST TECHNIQUE: Multidetector CT imaging of the head and cervical spine was performed following the standard protocol without intravenous contrast. Multiplanar CT image reconstructions of the cervical spine were also generated. COMPARISON:  03/09/2015 FINDINGS: CT HEAD FINDINGS Generalized atrophy with mild chronic microvascular ischemic change in the white matter. Negative for acute infarct.  Negative for hemorrhage or mass Negative for skull fracture. CT CERVICAL SPINE FINDINGS Mild anterior slip C4-5 with disc and facet degeneration. Moderate disc degeneration and spondylosis C5-6 and C6-7. Negative for fracture.  No bony mass lesion. Carotid artery calcification. Numerous lung nodules bilaterally, concerning for metastatic disease. IMPRESSION: Atrophy and chronic microvascular ischemia. No acute intracranial abnormality Cervical spine degenerative change.  Negative for fracture Numerous lung nodules, consistent with metastatic disease. Electronically Signed   By: Franchot Gallo M.D.   On: 08/11/2015 11:27  Dg Hip Unilat With Pelvis 2-3 Views Left  08/11/2015  CLINICAL DATA:  Pain following fall EXAM: DG HIP (WITH OR WITHOUT PELVIS) 2-3V LEFT COMPARISON:  None. FINDINGS: Frontal pelvis as well as frontal and lateral left hip images were obtained. There is a subcapital femoral neck fracture on the left with varus angulation at fracture site. There is mild superior migration of the left femoral shaft. No other fractures. No dislocation. There is mild symmetric narrowing of both hip joints. Bones appear somewhat osteoporotic. IMPRESSION: Subcapital femoral neck fracture on the left with superior migration  of the femoral shaft and varus angulation of the fracture site. No dislocation. Bones osteoporotic. Mild symmetric narrowing both hip joints. Electronically Signed   By: Lowella Grip III M.D.   On: 08/11/2015 11:47    ASSESSMENT / PLAN   Femoral neck fracture, left.  -Admit to Medical bed -Keep NPO for surgery today -Continue IVF -Pain control -Will probably need to go to Rehab facility upon discharge  Metastatic disease. Numerous lung nodules consistent with metastatic disease seen on chest x-ray today. PCP already in process of arranging for staging CT scans. -EDP has placed Palliative Care consult  Left flank pain. PCP started cipro for possible UTI based on abnormal u/a. Today's u/a not overly concerning, will hold antibiotics for now.   Hypertension.  BP markedly elevated on admission, now stable at 139/ 73.  -Continue home Avapro  Dementia.  Walks 2 miles /day. Lives in assisted living facility but fairly independent   Acute kidney failure. Baseline unknown. Cr 1.78. May improve with IVF.  -check am bmet    Hypothermia. Temp on arrival 94.2, up to 96.4 on bair hugger    Hypothyroidism.  -resume home synthroid when taking PO  CONSULTANTS:   Orthopedics  Code Status: DNR DVT Prophylaxis: SCDs  Family Communication:    Disposition Plan: Discharge to home or most likely rehab in 3-4 days   Time spent: 60 minutes Tye Savoy  NP [facility] Pager [phone]

## 2015-08-12 ENCOUNTER — Encounter (HOSPITAL_COMMUNITY): Payer: Self-pay | Admitting: Orthopedic Surgery

## 2015-08-12 DIAGNOSIS — Z515 Encounter for palliative care: Secondary | ICD-10-CM | POA: Insufficient documentation

## 2015-08-12 DIAGNOSIS — R109 Unspecified abdominal pain: Secondary | ICD-10-CM

## 2015-08-12 DIAGNOSIS — F0390 Unspecified dementia without behavioral disturbance: Secondary | ICD-10-CM

## 2015-08-12 DIAGNOSIS — S72002A Fracture of unspecified part of neck of left femur, initial encounter for closed fracture: Secondary | ICD-10-CM

## 2015-08-12 DIAGNOSIS — N179 Acute kidney failure, unspecified: Secondary | ICD-10-CM

## 2015-08-12 LAB — CBC
HEMATOCRIT: 28.9 % — AB (ref 36.0–46.0)
HEMOGLOBIN: 9.3 g/dL — AB (ref 12.0–15.0)
MCH: 29.2 pg (ref 26.0–34.0)
MCHC: 32.2 g/dL (ref 30.0–36.0)
MCV: 90.6 fL (ref 78.0–100.0)
Platelets: 284 10*3/uL (ref 150–400)
RBC: 3.19 MIL/uL — ABNORMAL LOW (ref 3.87–5.11)
RDW: 12.1 % (ref 11.5–15.5)
WBC: 11 10*3/uL — ABNORMAL HIGH (ref 4.0–10.5)

## 2015-08-12 LAB — BASIC METABOLIC PANEL
Anion gap: 12 (ref 5–15)
BUN: 36 mg/dL — ABNORMAL HIGH (ref 6–20)
CALCIUM: 9.3 mg/dL (ref 8.9–10.3)
CO2: 20 mmol/L — ABNORMAL LOW (ref 22–32)
CREATININE: 2 mg/dL — AB (ref 0.44–1.00)
Chloride: 103 mmol/L (ref 101–111)
GFR, EST AFRICAN AMERICAN: 25 mL/min — AB (ref >60)
GFR, EST NON AFRICAN AMERICAN: 21 mL/min — AB (ref >60)
Glucose, Bld: 145 mg/dL — ABNORMAL HIGH (ref 65–99)
Potassium: 4.7 mmol/L (ref 3.5–5.1)
SODIUM: 135 mmol/L (ref 135–145)

## 2015-08-12 MED ORDER — ACETAMINOPHEN 650 MG RE SUPP: 650.0000 mg | Freq: Four times a day (QID) | RECTAL | Status: DC | PRN

## 2015-08-12 MED ORDER — FERROUS SULFATE 325 (65 FE) MG PO TABS
325.0000 mg | ORAL_TABLET | Freq: Every day | ORAL | Status: DC
  Administered 2015-08-12 – 2015-08-14 (×3): 325 mg via ORAL
  Filled 2015-08-12 (×3): qty 1

## 2015-08-12 MED ORDER — CEFAZOLIN SODIUM-DEXTROSE 2-3 GM-% IV SOLR
2.0000 g | Freq: Once | INTRAVENOUS | Status: AC
  Administered 2015-08-12: 2 g via INTRAVENOUS
  Filled 2015-08-12: qty 50

## 2015-08-12 MED ORDER — ALUM & MAG HYDROXIDE-SIMETH 200-200-20 MG/5ML PO SUSP: 30.0000 mL | ORAL | Status: DC | PRN

## 2015-08-12 MED ORDER — LABETALOL HCL 5 MG/ML IV SOLN
INTRAVENOUS | Status: AC
  Administered 2015-08-12: 5 mg via INTRAVENOUS
  Filled 2015-08-12: qty 4

## 2015-08-12 MED ORDER — DOCUSATE SODIUM 100 MG PO CAPS
100.0000 mg | ORAL_CAPSULE | Freq: Two times a day (BID) | ORAL | Status: DC
  Administered 2015-08-12 – 2015-08-14 (×5): 100 mg via ORAL
  Filled 2015-08-12 (×4): qty 1

## 2015-08-12 MED ORDER — ONDANSETRON HCL 4 MG/2ML IJ SOLN
4.0000 mg | Freq: Four times a day (QID) | INTRAMUSCULAR | Status: DC | PRN
Start: 2015-08-12 — End: 2015-08-14

## 2015-08-12 MED ORDER — IRBESARTAN 75 MG PO TABS
75.0000 mg | ORAL_TABLET | Freq: Every day | ORAL | Status: DC
  Administered 2015-08-12: 75 mg via ORAL
  Filled 2015-08-12: qty 1

## 2015-08-12 MED ORDER — SODIUM CHLORIDE 0.9 % IV SOLN
20.0000 mL | INTRAVENOUS | Status: DC
  Administered 2015-08-12: 75 mL via INTRAVENOUS
  Administered 2015-08-12 – 2015-08-14 (×2): 20 mL via INTRAVENOUS

## 2015-08-12 MED ORDER — SUGAMMADEX SODIUM 200 MG/2ML IV SOLN
INTRAVENOUS | Status: AC
  Filled 2015-08-12: qty 2

## 2015-08-12 MED ORDER — ENSURE ENLIVE PO LIQD
237.0000 mL | Freq: Two times a day (BID) | ORAL | Status: DC
  Administered 2015-08-13: 237 mL via ORAL

## 2015-08-12 MED ORDER — ACETAMINOPHEN 325 MG PO TABS: 650.0000 mg | ORAL_TABLET | Freq: Four times a day (QID) | ORAL | Status: DC | PRN

## 2015-08-12 MED ORDER — HYDROMORPHONE HCL 1 MG/ML IJ SOLN: 0.1000 mg | INTRAMUSCULAR | Status: DC | PRN

## 2015-08-12 MED ORDER — METHOCARBAMOL 500 MG PO TABS: 500.0000 mg | ORAL_TABLET | Freq: Four times a day (QID) | ORAL | Status: DC | PRN

## 2015-08-12 MED ORDER — METHOCARBAMOL 1000 MG/10ML IJ SOLN
500.0000 mg | Freq: Four times a day (QID) | INTRAVENOUS | Status: DC | PRN
  Filled 2015-08-12: qty 5

## 2015-08-12 MED ORDER — ASPIRIN EC 325 MG PO TBEC
325.0000 mg | DELAYED_RELEASE_TABLET | Freq: Two times a day (BID) | ORAL | Status: DC
Start: 2015-08-12 — End: 2015-08-14
  Administered 2015-08-12 – 2015-08-14 (×5): 325 mg via ORAL
  Filled 2015-08-12 (×5): qty 1

## 2015-08-12 MED ORDER — DEXTROSE-NACL 5-0.45 % IV SOLN
INTRAVENOUS | Status: DC
  Administered 2015-08-12: 09:00:00 via INTRAVENOUS

## 2015-08-12 MED ORDER — LABETALOL HCL 5 MG/ML IV SOLN
5.0000 mg | INTRAVENOUS | Status: DC | PRN
  Administered 2015-08-12: 5 mg via INTRAVENOUS

## 2015-08-12 MED ORDER — ACETAMINOPHEN 325 MG PO TABS
650.0000 mg | ORAL_TABLET | Freq: Four times a day (QID) | ORAL | Status: AC
  Administered 2015-08-12 (×2): 650 mg via ORAL
  Filled 2015-08-12 (×2): qty 2

## 2015-08-12 MED ORDER — HYDROCODONE-ACETAMINOPHEN 5-325 MG PO TABS
1.0000 | ORAL_TABLET | Freq: Four times a day (QID) | ORAL | Status: DC | PRN
  Administered 2015-08-12: 1 via ORAL
  Filled 2015-08-12: qty 1

## 2015-08-12 MED ORDER — ONDANSETRON HCL 4 MG PO TABS: 4.0000 mg | ORAL_TABLET | Freq: Four times a day (QID) | ORAL | Status: DC | PRN

## 2015-08-12 NOTE — Transfer of Care (Signed)
Immediate Anesthesia Transfer of Care Note  Patient: Janice Summers  Procedure(s) Performed: Procedure(s): ANTERIOR APPROACH HEMI HIP ARTHROPLASTY (Left)  Patient Location: PACU  Anesthesia Type:General  Level of Consciousness: sedated, patient cooperative, confused and responds to stimulation  Airway & Oxygen Therapy: Patient Spontanous Breathing, Patient connected to nasal cannula oxygen and Patient connected to face mask oxygen  Post-op Assessment: Report given to RN, Post -op Vital signs reviewed and stable and Patient moving all extremities X 4  Post vital signs: Reviewed and stable  Last Vitals:  Filed Vitals:   08/11/15 2103 08/11/15 2345  BP: 157/72   Pulse: 98 91  Temp: 36.6 C 36.5 C  Resp: 18 8    Complications: No apparent anesthesia complications

## 2015-08-12 NOTE — Evaluation (Addendum)
Physical Therapy Evaluation Patient Details Name: Janice Summers MRN: [id number] DOB: October 15, 1926 Today's Date: 08/12/2015   History of Present Illness  81 yo female in home fell trying to get to BR, now with hemiarthroplasty L hip with direct anterior approach, WBAT.  PMHx:  Dementia, osteoporosis, lung mets  Clinical Impression  Pt was able to assist with movement mainly as cued and with repetition.  Her concern with attached lines was distracting but could be redirected to assist mobility.  Will need focused assistance with gait and therefore do not recommend immediate return to living with family if possible.  Plan is for inpt therapy for a couple weeks to regain independence if possible.    Follow Up Recommendations SNF    Equipment Recommendations  Rolling walker with 5" wheels    Recommendations for Other Services Rehab consult     Precautions / Restrictions Precautions Precautions: Fall Restrictions Weight Bearing Restrictions: Yes Other Position/Activity Restrictions: WBAT      Mobility  Bed Mobility Overal bed mobility: Needs Assistance Bed Mobility: Supine to Sit     Supine to sit: Mod assist     General bed mobility comments: HOB up and resists with trunk initially  Transfers Overall transfer level: Needs assistance Equipment used: Rolling walker (2 wheeled);1 person hand held assist Transfers: Sit to/from [facility] Sit to Stand: Mod assist Stand pivot transfers: Min assist       General transfer comment: dense recues for hand placement and safety/sequence  Ambulation/Gait Ambulation/Gait assistance: Min assist Ambulation Distance (Feet): 4 Feet Assistive device: Rolling walker (2 wheeled);1 person hand held assist Gait Pattern/deviations: Step-to pattern;Decreased weight shift to left;Antalgic;Wide base of support;Trunk flexed Gait velocity: reduced Gait velocity interpretation: Below normal speed for age/gender General Gait  Details: reminders for pt to move walker and set up transition to sit  Stairs            Wheelchair Mobility    Modified Rankin (Stroke Patients Only)       Balance Overall balance assessment: Needs assistance Sitting-balance support: Feet supported Sitting balance-Leahy Scale: Fair Sitting balance - Comments: guarded due to cognition Postural control: Posterior lean Standing balance support: Bilateral upper extremity supported Standing balance-Leahy Scale: Poor Standing balance comment: needs reminders for sequence to control initial standing                             Pertinent Vitals/Pain Pain Assessment: Faces Pain Score: 4  Faces Pain Scale: Hurts little more Pain Location: L LE with movement Pain Intervention(s): Monitored during session;Premedicated before session;Repositioned    Home Living Family/patient expects to be discharged to:: Assisted living                      Prior Function Level of Independence: Independent;Needs assistance   Gait / Transfers Assistance Needed: independent  ADL's / Homemaking Assistance Needed: son cares for her         Hand Dominance        Extremity/Trunk Assessment   Upper Extremity Assessment: Generalized weakness           Lower Extremity Assessment: Generalized weakness;LLE deficits/detail   LLE Deficits / Details: pain with mobility LLE  Cervical / Trunk Assessment: Kyphotic  Communication   Communication: No difficulties (dementia)  Cognition Arousal/Alertness: Awake/alert Behavior During Therapy:  (confused but cooperative) Overall Cognitive Status: History of cognitive impairments - at baseline  Memory: Decreased recall of precautions;Decreased short-term memory              General Comments General comments (skin integrity, edema, etc.): Pt was noted to have foley cath, O2 and  IV, and was figdety with her lines and attachments.      Exercises         Assessment/Plan    PT Assessment Patient needs continued PT services  PT Diagnosis Generalized weakness;Acute pain;Altered mental status   PT Problem List Decreased strength;Decreased activity tolerance;Decreased range of motion;Decreased balance;Decreased mobility;Decreased coordination;Decreased cognition;Decreased knowledge of use of DME;Decreased safety awareness;Decreased knowledge of precautions;Decreased skin integrity;Pain  PT Treatment Interventions DME instruction;Gait training;Functional mobility training;Therapeutic activities;Therapeutic exercise;Balance training;Neuromuscular re-education;Cognitive remediation;Patient/family education   PT Goals (Current goals can be found in the Care Plan section) Acute Rehab PT Goals Patient Stated Goal: none stated PT Goal Formulation: With patient/family Time For Goal Achievement: 08/26/15 Potential to Achieve Goals: Good    Frequency 7X/week   Barriers to discharge Other (comment) (ALF will not be staffed for new hemiarthroplasty needs) Needs 24/7 mobility assistance    Co-evaluation               End of Session Equipment Utilized During Treatment: Gait belt;Oxygen Activity Tolerance: Patient tolerated treatment well;Patient limited by fatigue Patient left: in chair;with call bell/phone within reach;with chair alarm set;with nursing/sitter in room (alarm has no box and nursing aware) Nurse Communication: Mobility status         Time: [id number] PT Time Calculation (min) (ACUTE ONLY): 35 min   Charges:   PT Evaluation $Initial PT Evaluation Tier I: 1 Procedure PT Treatments $Gait Training: 8-22 mins   PT G Codes:        Ramond Dial September 09, 2015, 1:22 PM   Mee Hives, PT MS Acute Rehab Dept. Number: [phone] [phone] and [place] [phone]

## 2015-08-12 NOTE — Progress Notes (Signed)
OT Cancellation Note  Patient Details Name: Janice Summers MRN: [id number] DOB: 1927-05-30   Cancelled Treatment:    Reason Eval/Treat Not Completed: Other (comment) Pt is Medicare and current D/C plan is SNF. No apparent immediate acute care OT needs, therefore will defer OT to SNF. If OT eval is needed please call Acute Rehab Dept. at [phone] or text page OT at [phone].  [place], OTR/L  [id number] 08/12/2015 08/12/2015, 10:00 PM

## 2015-08-12 NOTE — Progress Notes (Signed)
Dr. Smith Robert at bedside, 02 sat up to 100% with 100% non rebreather, 15L of 02, nasal airway to L nare.

## 2015-08-12 NOTE — Anesthesia Postprocedure Evaluation (Signed)
Anesthesia Post Note  Patient: Janice Summers  Procedure(s) Performed: Procedure(s) (LRB): ANTERIOR APPROACH HEMI HIP ARTHROPLASTY (Left)  Patient location during evaluation: PACU Anesthesia Type: General Level of consciousness: awake and alert Pain management: pain level controlled Vital Signs Assessment: post-procedure vital signs reviewed and stable Respiratory status: spontaneous breathing, nonlabored ventilation, respiratory function stable and patient connected to nasal cannula oxygen Cardiovascular status: blood pressure returned to baseline and stable Postop Assessment: no signs of nausea or vomiting Anesthetic complications: no    Last Vitals:  Filed Vitals:   08/12/15 0105 08/12/15 0123  BP: 118/57 105/56  Pulse: 72 66  Temp: 36.4 C 36.4 C  Resp: 18 17    Last Pain:  Filed Vitals:   08/12/15 0124  PainSc: 0-No pain                 Effie Berkshire

## 2015-08-12 NOTE — Clinical Social Work Placement (Signed)
   CLINICAL SOCIAL WORK PLACEMENT  NOTE  Date:  08/12/2015  Patient Details  Name: Janice Summers MRN: [id number] Date of Birth: Jan 24, 1927  Clinical Social Work is seeking post-discharge placement for this patient at the [facility] level of care (*CSW will initial, date and re-position this form in  chart as items are completed):  Yes   Patient/family provided with [place] Work Department's list of facilities offering this level of care within the geographic area requested by the patient (or if unable, by the patient's family).  Yes   Patient/family informed of their freedom to choose among providers that offer the needed level of care, that participate in Medicare, Medicaid or managed care program needed by the patient, have an available bed and are willing to accept the patient.  Yes   Patient/family informed of 's ownership interest in [facility] and [facility], as well as of the fact that they are under no obligation to receive care at these facilities.  PASRR submitted to EDS on 08/12/15     PASRR number received on 08/12/15     Existing PASRR number confirmed on       FL2 transmitted to all facilities in geographic area requested by pt/family on 08/12/15     FL2 transmitted to all facilities within larger geographic area on       Patient informed that his/her managed care company has contracts with or will negotiate with certain facilities, including the following:            Patient/family informed of bed offers received.  Patient chooses bed at       Physician recommends and patient chooses bed at      Patient to be transferred to   on  .  Patient to be transferred to facility by       Patient family notified on   of transfer.  Name of family member notified:        PHYSICIAN       Additional Comment:    _______________________________________________ Sable Feil, LCSW 08/12/2015, 1:04  PM

## 2015-08-12 NOTE — Op Note (Signed)
NAMEMALKE, FANTI NO.:  [id number]  MEDICAL RECORD NO.:  [id number]  LOCATION:  6E08C                        FACILITY:  [facility]  PHYSICIAN:  Alta Corning, M.D.   DATE OF BIRTH:  February 11, 1927  DATE OF PROCEDURE:  08/11/2015 DATE OF DISCHARGE:                              OPERATIVE REPORT   PREOPERATIVE DIAGNOSIS:  Femoral neck fracture, left, with displacement.  POSTOPERATIVE DIAGNOSIS:  Femoral neck fracture, left, with displacement.  PROCEDURE: 1. Left femoral neck fracture, hemiarthroplasty with a Summit basic     stem, size 3 with a 45 mm +0 hip ball. 2. Interpretation of multiple intraoperative fluoroscopic images.  SURGEON:  Alta Corning, MD  ASSISTANT:  Gary Fleet, PA  ANESTHESIA:  General.  BRIEF HISTORY:  Janice Summers is an 81 year old female with history of falling.  Yesterday, she unfortunately was on her own overnight and was found this morning with a fractured hip.  She was brought to the emergency room where this was identified.  We were consulted for management and Medicine admitted her, got her warmed up and tuned up for surgery and felt that she was appropriate for surgical intervention. She was taken to the operating room for hemiarthroplasty, left.  We felt that anterior approach would be appropriate given that we thought it could be done reasonably easily and it might make her recovery easier. She was brought to the operating room for this procedure.  DESCRIPTION OF PROCEDURE:  The patient was brought to the operating room.  After adequate anesthesia was obtained with general anesthetic, patient was placed supine on the operating table.  The left hip was prepped and draped in sterile fashion.  She was placed on the [place] bed initially and after prep and drape, incision was made for an anterior approach to the hip.  Subcutaneous tissue down all the tensor fascia was identified.  A rent was made in the fascia.  Fascia was  retracted anteriorly and the muscle was dissected back to the interval. Retractors were put in place above and below the neck.  Vessels were cauterized.  The hip capsule was opened and tagged and the provisional neck cut was made and then the hip ball removed.  The hip was then irrigated, suctioned dry, checked for any fragmenting of bone and was checked for sizing, 45 was the appropriate size, it was put in and then removed.  Once this was done, attention was turned towards the stem side, posterior capsule was released, and the arm was put in place.  We were able to elevate the hip quite nicely, sequentially, rasped her up to a level of a size 3, tried a 4, I really could not get it about 2/3rd of the way down, the 3 fit quite good.  We buried it little bit more, countersunk it, and then did a calcar planing.  Once this was achieved, the final Summit size 3 was opened, it was +0, ball was put in place, 45 mm and then it was manually reduced, anatomic reduction was achieved. The capsule was closed.  Final x-ray images were taken and multiple intraoperative fluoroscopic images were taken.  At this point, the capsule was  closed with 1 Vicryl interrupted, the tensor fascia was closed with 0 Vicryl running, skin with 0 and 2-0 Vicryl, 3-0 Monocryl subcuticular.  Benzoin and Steri-Strips were applied and a sterile compressive dressing was applied.  The patient taken to recovery was noted to be in satisfactory condition. Estimated loss procedure was 250 mL.     Alta Corning, M.D.     Corliss Skains  D:  08/11/2015  T:  08/12/2015  Job:  [id number]

## 2015-08-12 NOTE — Progress Notes (Signed)
[facility] PROGRESS NOTE  Anwar Masten [id number] DOB: 1926/12/17 DOA: 08/11/2015 PCP: No primary care provider on file.  Assessment/Plan: 1. L hip fracture -s/p HEMI HIP ARTHROPLASTY 11/29 -tolerated well, started on ASA BID by Ortho for DVT prophylaxis -creatinine up slightly, continue IVf today  2. Lung nodules highly suspicious for Metastatic disease, also has a rib lesion -will d/w Son today regarding workup, Palliative medicine consulted per EDP yesterday  3. Hypertension.  -stable , hold Avapro due to AKI  4. AKI on CKD 3 -Baseline unknown. Cr 1.78 -hold Avapro, hydrate, monitor -bmet in am  5. Dementia -stable  6. Hypothyroidism.  -continue synthroid   CONSULTANTS: Orthopedics  Code Status: DNR Family Communication: none at bedside Disposition Plan: SNF at discharge   Consultants:  Ortho  Palliative medicine  Procedures: HEMI HIP ARTHROPLASTY  11/29  HPI/Subjective: Feels ok, some pain in L hip, no other issues  Objective: Filed Vitals:   08/12/15 0520 08/12/15 0816  BP: 118/64 145/75  Pulse: 81 89  Temp: 97.7 F (36.5 C) 97.9 F (36.6 C)  Resp: 18 17    Intake/Output Summary (Last 24 hours) at 08/12/15 1323 Last data filed at 08/12/15 1322  Gross per 24 hour  Intake   1820 ml  Output   1425 ml  Net    395 ml   Filed Weights   08/11/15 1709 08/11/15 2103  Weight: 50.9 kg (112 lb 3.4 oz) 50.9 kg (112 lb 3.4 oz)    Exam:   General:  AAOx to self and place only  Cardiovascular: S1S2/RRR  Respiratory: S1S2/RRR  Abdomen: soft, Nt, BS present  Musculoskeletal: no edema c/c,L hip with dressing c/d/i   Data Reviewed: Basic Metabolic Panel:  Recent Labs Lab 08/11/15 0936 08/12/15 0638  NA 130* 135  K 4.5 4.7  CL 99* 103  CO2 21* 20*  GLUCOSE 165* 145*  BUN 42* 36*  CREATININE 1.78* 2.00*  CALCIUM 9.8 9.3   Liver Function Tests: No results for input(s): AST, ALT, ALKPHOS, BILITOT, PROT, ALBUMIN in the  last 168 hours. No results for input(s): LIPASE, AMYLASE in the last 168 hours. No results for input(s): AMMONIA in the last 168 hours. CBC:  Recent Labs Lab 08/11/15 0936 08/12/15 1220  WBC 10.4 11.0*  NEUTROABS 9.4*  --   HGB 12.0 9.3*  HCT 35.5* 28.9*  MCV 89.4 90.6  PLT 327 284   Cardiac Enzymes:  Recent Labs Lab 08/11/15 0936  CKTOTAL 288*  TROPONINI <0.03   BNP (last 3 results) No results for input(s): BNP in the last 8760 hours.  ProBNP (last 3 results) No results for input(s): PROBNP in the last 8760 hours.  CBG: No results for input(s): GLUCAP in the last 168 hours.  Recent Results (from the past 240 hour(s))  Surgical pcr screen     Status: None   Collection Time: 08/11/15  7:57 PM  Result Value Ref Range Status   MRSA, PCR NEGATIVE NEGATIVE Final   Staphylococcus aureus NEGATIVE NEGATIVE Final    Comment:        The Xpert SA Assay (FDA approved for NASAL specimens in patients over 79 years of age), is one component of a comprehensive surveillance program.  Test performance has been validated by [facility] for patients greater than or equal to 32 year old. It is not intended to diagnose infection nor to guide or monitor treatment.      Studies: Dg Chest 1 View  08/11/2015  CLINICAL DATA:  Fall EXAM: CHEST 1 VIEW COMPARISON:  None. FINDINGS: Numerous lung nodules are present throughout both lungs. Most of the nodules are under 1 cm. Extrapleural mass in the right upper lobe related to a rib lesion with adjacent soft tissue mass. Heart size normal. Negative for heart failure. Negative for pneumonia or effusion. IMPRESSION: Widespread lung nodules consistent with metastatic disease. Rib lesion right third rib with adjacent soft tissue extrapleural mass. This is consistent with metastatic disease. Electronically Signed   By: Franchot Gallo M.D.   On: 08/11/2015 11:43   Ct Head Wo Contrast  08/11/2015  CLINICAL DATA:  Fall last night.  Found on floor  EXAM: CT HEAD WITHOUT CONTRAST CT CERVICAL SPINE WITHOUT CONTRAST TECHNIQUE: Multidetector CT imaging of the head and cervical spine was performed following the standard protocol without intravenous contrast. Multiplanar CT image reconstructions of the cervical spine were also generated. COMPARISON:  03/09/2015 FINDINGS: CT HEAD FINDINGS Generalized atrophy with mild chronic microvascular ischemic change in the white matter. Negative for acute infarct.  Negative for hemorrhage or mass Negative for skull fracture. CT CERVICAL SPINE FINDINGS Mild anterior slip C4-5 with disc and facet degeneration. Moderate disc degeneration and spondylosis C5-6 and C6-7. Negative for fracture.  No bony mass lesion. Carotid artery calcification. Numerous lung nodules bilaterally, concerning for metastatic disease. IMPRESSION: Atrophy and chronic microvascular ischemia. No acute intracranial abnormality Cervical spine degenerative change.  Negative for fracture Numerous lung nodules, consistent with metastatic disease. Electronically Signed   By: Franchot Gallo M.D.   On: 08/11/2015 11:27   Ct Cervical Spine Wo Contrast  08/11/2015  CLINICAL DATA:  Fall last night.  Found on floor EXAM: CT HEAD WITHOUT CONTRAST CT CERVICAL SPINE WITHOUT CONTRAST TECHNIQUE: Multidetector CT imaging of the head and cervical spine was performed following the standard protocol without intravenous contrast. Multiplanar CT image reconstructions of the cervical spine were also generated. COMPARISON:  03/09/2015 FINDINGS: CT HEAD FINDINGS Generalized atrophy with mild chronic microvascular ischemic change in the white matter. Negative for acute infarct.  Negative for hemorrhage or mass Negative for skull fracture. CT CERVICAL SPINE FINDINGS Mild anterior slip C4-5 with disc and facet degeneration. Moderate disc degeneration and spondylosis C5-6 and C6-7. Negative for fracture.  No bony mass lesion. Carotid artery calcification. Numerous lung nodules  bilaterally, concerning for metastatic disease. IMPRESSION: Atrophy and chronic microvascular ischemia. No acute intracranial abnormality Cervical spine degenerative change.  Negative for fracture Numerous lung nodules, consistent with metastatic disease. Electronically Signed   By: Franchot Gallo M.D.   On: 08/11/2015 11:27   Dg Hip Operative Unilat With Pelvis Left  08/12/2015  CLINICAL DATA:  Status post left hip joint replacement following an acute fracture EXAM: OPERATIVE left HIP (WITH PELVIS IF PERFORMED) 2 VIEWS TECHNIQUE: Fluoroscopic spot image(s) were submitted for interpretation post-operatively. Reported fluoro time is 12 seconds COMPARISON:  Preoperative images of the left hip of August 11, 2015 FINDINGS: The patient has undergone placement of a left hip prosthesis for a subcapital fracture. Radiographic positioning of the prosthetic components is good. The interface with the native bone appears normal. IMPRESSION: The patient has undergone left hip joint replacement without evidence of immediate postprocedure complication. Electronically Signed   By: David  Martinique M.D.   On: 08/12/2015 06:59   Dg Hip Unilat With Pelvis 2-3 Views Left  08/11/2015  CLINICAL DATA:  Pain following fall EXAM: DG HIP (WITH OR WITHOUT PELVIS) 2-3V LEFT COMPARISON:  None. FINDINGS: Frontal pelvis as well as frontal and lateral  left hip images were obtained. There is a subcapital femoral neck fracture on the left with varus angulation at fracture site. There is mild superior migration of the left femoral shaft. No other fractures. No dislocation. There is mild symmetric narrowing of both hip joints. Bones appear somewhat osteoporotic. IMPRESSION: Subcapital femoral neck fracture on the left with superior migration of the femoral shaft and varus angulation of the fracture site. No dislocation. Bones osteoporotic. Mild symmetric narrowing both hip joints. Electronically Signed   By: Lowella Grip III M.D.   On:  08/11/2015 11:47    Scheduled Meds: . acetaminophen  650 mg Oral 4 times per day  . aspirin EC  325 mg Oral BID PC  . docusate sodium  100 mg Oral BID  . ferrous sulfate  325 mg Oral Q breakfast  . irbesartan  75 mg Oral Daily  . levothyroxine  112 mcg Oral QAC breakfast   Continuous Infusions: . sodium chloride 75 mL (08/12/15 1040)   Antibiotics Given (last 72 hours)    Date/Time Action Medication Dose Rate   08/12/15 0836 Given   ceFAZolin (ANCEF) IVPB 2 g/50 mL premix 2 g 100 mL/hr      Active Problems:   Femoral neck fracture, left, closed, initial encounter   Hypertension   Acute kidney failure ([place])   Hypothermia   Hypothyroidism   Dementia   Metastatic disease ([place])   Left flank pain   Hip fracture ([place])    Time spent: 76min    Mahonri Seiden  [facility] Pager [phone]. If 7PM-7AM, please contact night-coverage at [email or web address], password [facility] 08/12/2015, 1:23 PM  LOS: 1 day

## 2015-08-12 NOTE — Progress Notes (Addendum)
Patient desating to 70's, CRNA started doing jaw thrust, 02 increase via NRB, nasal airway inserted by CRNA. Dr. Smith Robert at bedside, order to give narcan. Narcan 61mcg given  by Dr. Smith Robert and Lynnae Sandhoff CRNA.

## 2015-08-12 NOTE — Progress Notes (Signed)
Initial Nutrition Assessment  DOCUMENTATION CODES:   Not applicable  INTERVENTION:  Provide Ensure Enlive po BID, each supplement provides 350 kcal and 20 grams of protein.  Encourage adequate PO intake.   NUTRITION DIAGNOSIS:   Increased nutrient needs related to chronic illness as evidenced by estimated needs.  GOAL:   Patient will meet greater than or equal to 90% of their needs  MONITOR:   PO intake, Supplement acceptance, Weight trends, Labs, I & O's  REASON FOR ASSESSMENT:   Malnutrition Screening Tool    ASSESSMENT:   81 y.o. female brought to emergency department this morning after a fall at home. Patient has dementia. Outpatient chest x-ray a few days ago suggested metastatic disease  PROCEDURE (11/29): ANTERIOR APPROACH HEMI HIP ARTHROPLASTY (Left)  Pt reports her appetite is fine currently and PTA with consumption of at least 3 meals daily. Current meal completion has been 50%. Pt reports usual body weight of 120 lbs, however unable to recall when she last weighed that. Noted pt with dementia and some confusion during time of visit. No family at bedside. Pt is agreeable to nutritional supplements. RD to order. Pt was encouraged to eat her food at meals.   Nutrition-Focused physical exam completed. Findings are no fat depletion, moderate muscle depletion, and no edema.   Labs and medications reviewed.   Diet Order:  Diet regular Room service appropriate?: Yes; Fluid consistency:: Thin  Skin:   (Incision L hip)  Last BM:  11/26  Height:   Ht Readings from Last 1 Encounters:  08/11/15 5\' 4"  (1.626 m)    Weight:   Wt Readings from Last 1 Encounters:  08/11/15 112 lb 3.4 oz (50.9 kg)    Ideal Body Weight:  54.5 kg  BMI:  Body mass index is 19.25 kg/(m^2).  Estimated Nutritional Needs:   Kcal:  1400-1600  Protein:  65-75 grams  Fluid:  >/= 1.5 L/day  EDUCATION NEEDS:   No education needs identified at this time  Corrin Parker, MS, RD,  LDN Pager # [phone] After hours/ weekend pager # [phone]

## 2015-08-12 NOTE — NC FL2 (Signed)
[place] LEVEL OF CARE SCREENING TOOL     IDENTIFICATION  Patient Name: Janice Summers Birthdate: Mar 14, 1927 Sex: female Admission Date (Current Location): 08/11/2015  [facility] and [place] Number: Herbalist and Address:  [facility] [place], [place] [address], [place], [place] [address]      Provider Number: [id number]  Attending Physician Name and Address:  Domenic Polite, MD  Relative Name and Phone Number:  Joan Flores - son.  Phone number [phone]    Current Level of Care: Hospital Recommended Level of Care: [place] Prior Approval Number:    Date Approved/Denied:   PASRR Number: [id number] (Effective 08/12/15)  Discharge Plan: SNF    Current Diagnoses: Patient Active Problem List   Diagnosis Date Noted  . Femoral neck fracture, left, closed, initial encounter 08/11/2015  . Hypertension 08/11/2015  . Acute kidney failure ([place]) 08/11/2015  . Hypothermia 08/11/2015  . Hypothyroidism 08/11/2015  . Dementia 08/11/2015  . Metastatic disease ([place]) 08/11/2015  . Left flank pain 08/11/2015  . Hip fracture ([place]) 08/11/2015    Orientation ACTIVITIES/SOCIAL BLADDER RESPIRATION    Self, Place, Situation  Passive, Family supportive Continent, Indwelling catheter (Catheter placed 11/29 for surgical procedue and removed 11/30) Normal  BEHAVIORAL SYMPTOMS/MOOD NEUROLOGICAL BOWEL NUTRITION STATUS      Continent Diet (Regular)  PHYSICIAN VISITS COMMUNICATION OF NEEDS Height & Weight Skin    Verbally   112 lbs. Normal, Other (Comment) (Incision left hip)          AMBULATORY STATUS RESPIRATION     (Ambulation minimum assist per PT) Normal      Personal Care Assistance Level of Assistance  Bathing, Feeding, Dressing Bathing Assistance: Limited assistance Feeding assistance: Independent Dressing Assistance: Limited assistance      Functional Limitations Info                [place]  PT  (By licensed PT)     PT Frequency: Evaluated 08/11/15. Therapy recommended 7x per week             Additional Factors Info  Code Status, Allergies Code Status Info: DNR Allergies Info: No known allergies           Current Medications (08/12/2015):  This is the current hospital active medication list Current Facility-Administered Medications  Medication Dose Route Frequency Provider Last Rate Last Dose  . 0.9 %  sodium chloride infusion  20 mL Intravenous Continuous Domenic Polite, MD 75 mL/hr at 08/12/15 1040 75 mL at 08/12/15 1040  . acetaminophen (TYLENOL) tablet 650 mg  650 mg Oral Q6H PRN Gary Fleet, PA-C       Or  . acetaminophen (TYLENOL) suppository 650 mg  650 mg Rectal Q6H PRN Gary Fleet, PA-C      . acetaminophen (TYLENOL) tablet 650 mg  650 mg Oral 4 times per day Gary Fleet, PA-C   650 mg at 08/12/15 0837  . alum & mag hydroxide-simeth (MAALOX/MYLANTA) 200-200-20 MG/5ML suspension 30 mL  30 mL Oral Q4H PRN Gary Fleet, PA-C      . aspirin EC tablet 325 mg  325 mg Oral BID PC Gary Fleet, PA-C   325 mg at 08/12/15 [id number]  . bisacodyl (DULCOLAX) suppository 10 mg  10 mg Rectal Daily PRN Willia Craze, NP      . docusate sodium (COLACE) capsule 100 mg  100 mg Oral BID Gary Fleet, PA-C   100 mg at 08/12/15 1038  . ferrous sulfate  tablet 325 mg  325 mg Oral Q breakfast Gary Fleet, PA-C   325 mg at 08/12/15 [id number]  . HYDROcodone-acetaminophen (NORCO/VICODIN) 5-325 MG per tablet 1 tablet  1 tablet Oral Q6H PRN Gary Fleet, PA-C      . HYDROmorphone (DILAUDID) injection 0.1 mg  0.1 mg Intravenous Q2H PRN Gary Fleet, PA-C      . irbesartan (AVAPRO) tablet 75 mg  75 mg Oral Daily Gary Fleet, PA-C   75 mg at 08/12/15 1038  . labetalol (NORMODYNE,TRANDATE) injection 5 mg  5 mg Intravenous Q10 min PRN Effie Berkshire, MD   5 mg at 08/12/15 0015  . levothyroxine (SYNTHROID, LEVOTHROID) tablet 112 mcg  112 mcg Oral QAC breakfast Willia Craze, NP   112 mcg  at 08/12/15 [id number]  . morphine 2 MG/ML injection 0.5 mg  0.5 mg Intravenous Q2H PRN Willia Craze, NP   0.5 mg at 08/11/15 1737  . ondansetron (ZOFRAN) tablet 4 mg  4 mg Oral Q6H PRN Gary Fleet, PA-C       Or  . ondansetron [facility]) injection 4 mg  4 mg Intravenous Q6H PRN Gary Fleet, PA-C         Discharge Medications: Please see discharge summary for a list of discharge medications.  Relevant Imaging Results:  Relevant Lab Results:  Recent Labs    Additional Information ANTERIOR APPROACH HEMI HIP ARTHROPLASTY (Left) preformed on 08/11/15  Sable Feil, LCSW

## 2015-08-12 NOTE — Progress Notes (Signed)
Family requests RN to remove foley catheter on 12/1 morning instead of 11/30, educated family.

## 2015-08-12 NOTE — Care Management (Signed)
Utilization review completed. Savonna Birchmeier, RN Case Manager [phone]. 

## 2015-08-12 NOTE — Progress Notes (Signed)
PT Cancellation Note  Patient Details Name: Analiah Goodell MRN: [id number] DOB: Sep 21, 1926   Cancelled Treatment:    Reason Eval/Treat Not Completed: Other (comment) (Pt was having lunch then being seen by other staff).  Will try again tomorrow.   Ramond Dial 08/12/2015, 2:08 PM   Mee Hives, PT MS Acute Rehab Dept. Number: [facility] [id number] and [place] [phone]

## 2015-08-12 NOTE — Consult Note (Signed)
Consultation Note Date: 08/12/2015   Patient Name: Janice Summers  DOB: 19-Sep-1926  MRN: [id number]  Age / Sex: 81 y.o., female  PCP: No primary care provider on file. Referring Physician: Domenic Polite, MD  Reason for Consultation: Establishing goals of care    Clinical Assessment/Narrative:  Patient is 81 year old lady with a past medical history significant for mild dementia, resident of assisted living facility. Patient was living in [place], [place] state. Taking care of her elderly husband. She was moved to [place], [place] by her son in April 2016. Over the course of the past few weeks-months, the patient has complained of pain in her abdomen/sides. She had established with Dr. Ernie Hew from primary care.  The patient was recently diagnosed with a urinary tract infection. The patient fell by her bed and was transported to the emergency department where she was diagnosed with a left hip fracture. It is also noted that she had a chest x-ray in the outpatient setting that showed numerous lung nodules. Chest x-ray in the emergency department as well as CT scan of the cervical spine also showed numerous lung nodules consistent with metastatic disease. Additionally, chest x-ray also showed right third rib lesion with adjacent soft tissue extrapleural mass consistent with metastatic disease. Patient was seen and evaluated by orthopedics, underwent hip surgery.  The patient is awake alert resting in bed. She gets confused/frustrated easily. She is able to state her name. She is able to state how she fell. She denies any pain. She has participated in physical therapy.  Call placed to patient's healthcare power of attorney son Joan Flores at [phone]. Discussed in detail with Marisa Severin wife, patient's daughter-in-law at the bedside. Family meeting held. Discussed about the patient's current diagnosis of hip  fracture status post surgery, radiologic evidence of widespread lung nodules possibly consistent with metastatic disease.  Patient's CODE STATUS is established as DO NOT RESUSCITATE/DO NOT INTUBATE. Additionally, most form also given, instructions discussed in great detail. Will follow-up with the family in 24 hours for completion of most form. Discussed in detail about workup such as CT scans, biopsies. Family states that they would not want the patient to get chemotherapy. They stated that they would not want the patient to undergo extensive diagnostic workup. They wish to focus on making sure she rehabilitated from her hip fracture repair. They wish to avoid unnecessary hospitalizations, extensive testing/procedures.    Contacts/Participants in Discussion: patient's daughter in law Cameron Primary Decision Maker: son Joan Flores  Relationship to Patient son HCPOA: yes     SUMMARY OF RECOMMENDATIONS: DNR DNI Family to complete MOST form Family does not think that extensive work up would represent the patient's wishes or be in her best interests. SNF rehab towards the end of this hospitalization.    Code Status/Advance Care Planning: DNR    Code Status Orders        Start     Ordered   08/12/15 0649  Do not attempt resuscitation (DNR)   Continuous    Question Answer Comment  In the event of cardiac or respiratory ARREST Do not call a "code blue"   In the event of cardiac or respiratory ARREST Do not perform Intubation, CPR, defibrillation or ACLS   In the event of cardiac or respiratory ARREST Use medication by any route, position, wound care, and other measures to relive pain and suffering. May use oxygen, suction and manual treatment of airway obstruction as needed for comfort.      08/12/15 [id number]  Other Directives:Other  Symptom Management:    continue current treatment.   Palliative Prophylaxis:   Bowel Regimen  Additional Recommendations (Limitations, Scope,  Preferences):  Avoid Hospitalization     Psycho-social/Spiritual:  Support System: [place] Desire for further Chaplaincy support:no Additional Recommendations: Caregiving  Support/Resources  Prognosis: < 12 months  Discharge Planning: [place] for rehab with Palliative care service follow-up   Chief Complaint/ Primary Diagnoses: Present on Admission:  . Femoral neck fracture, left, closed, initial encounter . Hypothermia . Metastatic disease ([place]) . Left flank pain . Hip fracture ([place])  I have reviewed the medical record, interviewed the patient and family, and examined the patient. The following aspects are pertinent.  Past Medical History  Diagnosis Date  . Thyroid disease   . Dementia    Social History   Social History  . Marital Status: Married    Spouse Name: N/A  . Number of Children: N/A  . Years of Education: N/A   Social History Main Topics  . Smoking status: Never Smoker   . Smokeless tobacco: None  . Alcohol Use: No  . Drug Use: No  . Sexual Activity: Not Asked   Other Topics Concern  . None   Social History Narrative  . None   History reviewed. No pertinent family history. Scheduled Meds: . acetaminophen  650 mg Oral 4 times per day  . aspirin EC  325 mg Oral BID PC  . docusate sodium  100 mg Oral BID  . ferrous sulfate  325 mg Oral Q breakfast  . levothyroxine  112 mcg Oral QAC breakfast   Continuous Infusions: . sodium chloride 75 mL (08/12/15 1040)   PRN Meds:.acetaminophen **OR** acetaminophen, alum & mag hydroxide-simeth, bisacodyl, HYDROcodone-acetaminophen, HYDROmorphone (DILAUDID) injection, labetalol, morphine injection, ondansetron **OR** ondansetron (ZOFRAN) IV Medications Prior to Admission:  Prior to Admission medications   Medication Sig Start Date End Date Taking? Authorizing Provider  ciprofloxacin (CIPRO) 500 MG tablet Take 500 mg by mouth 2 (two) times daily. For 10 days 08/10/15 08/20/15 Yes Historical  Provider, MD  irbesartan (AVAPRO) 75 MG tablet Take 75 mg by mouth daily.   Yes Historical Provider, MD  levothyroxine (SYNTHROID, LEVOTHROID) 112 MCG tablet Take 112 mcg by mouth daily before breakfast.   Yes Historical Provider, MD  vitamin B-12 (CYANOCOBALAMIN) 100 MCG tablet Take 100 mcg by mouth daily.   Yes Historical Provider, MD  vitamin E 100 UNIT capsule Take 100 Units by mouth daily.   Yes Historical Provider, MD  aspirin EC 325 MG tablet Take 1 tablet (325 mg total) by mouth 2 (two) times daily after a meal. Take x 1 month post op to decrease risk of blood clots. 08/11/15   Gary Fleet, PA-C  traMADol (ULTRAM) 50 MG tablet Take 1-2 tablets (50-100 mg total) by mouth every 6 (six) hours as needed for moderate pain. 08/11/15   Gary Fleet, PA-C   No Known Allergies  Review of Systems As above, unable to be completed due to patient's dementia.   Physical Exam  Elderly frail lady resting in bed NAD Clear S1 S2 Abdomen soft SCDs on Awake alert is able to state name but unable to provide detailed history due to her dementia.   Vital Signs: BP 145/75 mmHg  Pulse 89  Temp(Src) 97.9 F (36.6 C) (Oral)  Resp 17  Ht 5\' 4"  (1.626 m)  Wt 50.9 kg (112 lb 3.4 oz)  BMI 19.25 kg/m2  SpO2 96%  SpO2: SpO2: 96 % O2 Device:SpO2:  96 % O2 Flow Rate: .O2 Flow Rate (L/min): 3 L/min  IO: Intake/output summary:  Intake/Output Summary (Last 24 hours) at 08/12/15 1334 Last data filed at 08/12/15 1322  Gross per 24 hour  Intake   1820 ml  Output   1425 ml  Net    395 ml    LBM: Last BM Date: 08/08/15 Baseline Weight: Weight: 50.9 kg (112 lb 3.4 oz) Most recent weight: Weight: 50.9 kg (112 lb 3.4 oz)      Palliative Assessment/Data:  Flowsheet Rows        Most Recent Value   Intake Tab    Referral Department  Hospitalist   Unit at Time of Referral  Med/Surg Unit   Palliative Care Primary Diagnosis  Cancer   Date Notified  08/11/15   Palliative Care Type  New Palliative care     Reason for referral  Clarify Goals of Care   Date of Admission  08/11/15   # of days IP prior to Palliative referral  0   Clinical Assessment    Palliative Performance Scale Score  30%   Psychosocial & Spiritual Assessment    Palliative Care Outcomes       Additional Data Reviewed:  CBC:    Component Value Date/Time   WBC 11.0* 08/12/2015 1220   HGB 9.3* 08/12/2015 1220   HCT 28.9* 08/12/2015 1220   PLT 284 08/12/2015 1220   MCV 90.6 08/12/2015 1220   NEUTROABS 9.4* 08/11/2015 0936   LYMPHSABS 0.4* 08/11/2015 0936   MONOABS 0.6 08/11/2015 0936   EOSABS 0.0 08/11/2015 0936   BASOSABS 0.0 08/11/2015 0936   Comprehensive Metabolic Panel:    Component Value Date/Time   NA 135 08/12/2015 0638   K 4.7 08/12/2015 0638   CL 103 08/12/2015 0638   CO2 20* 08/12/2015 0638   BUN 36* 08/12/2015 0638   CREATININE 2.00* 08/12/2015 0638   GLUCOSE 145* 08/12/2015 0638   CALCIUM 9.3 08/12/2015 0638     Time In: 1000 Time Out: 1100 Time Total: 60 Greater than 50%  of this time was spent counseling and coordinating care related to the above assessment and plan.  Signed by: Loistine Chance, MD [id number] Loistine Chance, MD  08/12/2015, 1:34 PM  Please contact Palliative Medicine Team phone at [phone] for questions and concerns.

## 2015-08-12 NOTE — Clinical Social Work Note (Signed)
Clinical Social Work Assessment  Patient Details  Name: Janice Summers MRN: [id number] Date of Birth: 1927-07-18  Date of referral:  08/12/15               Reason for consult:  Facility Placement                Permission sought to share information with:  Family Supports Permission granted to share information::  Yes, Verbal Permission Granted  Name::     Joan Flores  Agency::     Relationship::  Son   Contact Information:  [phone]  Housing/Transportation Living arrangements for the past 2 months:  [place] of Information:  Adult Children Joan Flores) Patient Interpreter Needed:  None Criminal Activity/Legal Involvement Pertinent to Current Situation/Hospitalization:  No - Comment as needed Significant Relationships:  Adult Children Lives with:  Self Do you feel safe going back to the place where you live?  Yes (Patient's son understands that patient needs rehab before returning home) Need for family participation in patient care:  Yes (Comment)  Care giving concerns:  Son concerned about patient being at home alone after falling   Facilities manager / plan:  CSW visited with patient to talk about discharge planning and SNF recommendation. Patient sitting in a chair at bedside and was very pleasant but a bit confused as she could not stay on topic and at times her speech became unintelligible.  CSW then contacted son by phone regarding SNF placement for rehab and he is in agreement. Facility search process explained and Mr. Benny Lennert was emailed the SNF list for [facility].   Employment status:  Retired Health visitor, Futures trader PT Recommendations:  [place] / Referral to community resources:  [place] (Emailed SNF list for [facility] to patient's son)  Patient/Family's Response to care:  Son expressed no concerns regarding patient's care during hospitalization.  Patient/Family's  Understanding of and Emotional Response to Diagnosis, Current Treatment, and Prognosis:  Not discussed.  Emotional Assessment Appearance:  Appears stated age Attitude/Demeanor/Rapport:  Other (Pleasant) Affect (typically observed):  Appropriate, Pleasant Orientation:  Oriented to Self, Oriented to Place (CSW unsure if patient fully oriented to situation) Alcohol / Substance use:  Never Used Psych involvement (Current and /or in the community):  No (Comment)  Discharge Needs  Concerns to be addressed:  Discharge Planning Concerns Readmission within the last 30 days:  No Current discharge risk:  None Barriers to Discharge:  No Barriers Identified   Sable Feil, LCSW 08/12/2015, 12:56 PM

## 2015-08-12 NOTE — Progress Notes (Signed)
Subjective: 1 Day Post-Op Procedure(s) (LRB): ANTERIOR APPROACH HEMI HIP ARTHROPLASTY (Left) Patient reports pain as mild.  Patient is sitting up in the chair. She has no complaints of left hip pain. Her son is in the room with her. She is taking fluids by mouth and voiding okay.  Objective: Vital signs in last 24 hours: Temp:  [97.5 F (36.4 C)-98.2 F (36.8 C)] 97.6 F (36.4 C) (11/30 1529) Pulse Rate:  [66-104] 91 (11/30 1529) Resp:  [8-20] 17 (11/30 1529) BP: (105-221)/(56-96) 131/63 mmHg (11/30 1529) SpO2:  [91 %-100 %] 97 % (11/30 1529) Weight:  [50.9 kg (112 lb 3.4 oz)] 50.9 kg (112 lb 3.4 oz) (11/29 2103)  Intake/Output from previous day: 11/29 0701 - 11/30 0700 In: 1100 [I.V.:1100] Out: 1025 [Urine:825; Blood:200] Intake/Output this shift: Total I/O In: 720 [P.O.:720] Out: 400 [Urine:400]   Recent Labs  08/11/15 0936 08/12/15 1220  HGB 12.0 9.3*    Recent Labs  08/11/15 0936 08/12/15 1220  WBC 10.4 11.0*  RBC 3.97 3.19*  HCT 35.5* 28.9*  PLT 327 284    Recent Labs  08/11/15 0936 08/12/15 0638  NA 130* 135  K 4.5 4.7  CL 99* 103  CO2 21* 20*  BUN 42* 36*  CREATININE 1.78* 2.00*  GLUCOSE 165* 145*  CALCIUM 9.8 9.3    Recent Labs  08/11/15 0936  INR 1.11   left hip exam:  Neurovascular intact Sensation intact distally Intact pulses distally Dorsiflexion/Plantar flexion intact Incision: dressing C/D/I Compartment soft  Assessment/Plan: 1 Day Post-Op Procedure(s) (LRB): ANTERIOR APPROACH HEMI HIP ARTHROPLASTY (Left) Plan: Up with therapy Discharge to SNF in a couple of days. Probably Friday. Aspirin 325 mg enteric-coated twice daily with SCDs for DVT prophylaxis. Weight-bear as tolerated on left without hip precautions. Tramadol as needed for pain. I spoke with the son today about the treatment plan/disposition.  Nicole Defino G 08/12/2015, 4:38 PM

## 2015-08-13 ENCOUNTER — Encounter (HOSPITAL_COMMUNITY): Payer: Self-pay | Admitting: Orthopedic Surgery

## 2015-08-13 DIAGNOSIS — Z515 Encounter for palliative care: Secondary | ICD-10-CM

## 2015-08-13 DIAGNOSIS — T68XXXA Hypothermia, initial encounter: Secondary | ICD-10-CM

## 2015-08-13 DIAGNOSIS — C801 Malignant (primary) neoplasm, unspecified: Secondary | ICD-10-CM

## 2015-08-13 LAB — CBC
HEMATOCRIT: 31.5 % — AB (ref 36.0–46.0)
Hemoglobin: 10.3 g/dL — ABNORMAL LOW (ref 12.0–15.0)
MCH: 29.6 pg (ref 26.0–34.0)
MCHC: 32.7 g/dL (ref 30.0–36.0)
MCV: 90.5 fL (ref 78.0–100.0)
Platelets: 327 10*3/uL (ref 150–400)
RBC: 3.48 MIL/uL — AB (ref 3.87–5.11)
RDW: 12.6 % (ref 11.5–15.5)
WBC: 11 10*3/uL — AB (ref 4.0–10.5)

## 2015-08-13 LAB — BASIC METABOLIC PANEL
Anion gap: 11 (ref 5–15)
BUN: 43 mg/dL — AB (ref 6–20)
CHLORIDE: 106 mmol/L (ref 101–111)
CO2: 18 mmol/L — AB (ref 22–32)
Calcium: 8.6 mg/dL — ABNORMAL LOW (ref 8.9–10.3)
Creatinine, Ser: 1.8 mg/dL — ABNORMAL HIGH (ref 0.44–1.00)
GFR calc Af Amer: 28 mL/min — ABNORMAL LOW (ref >60)
GFR calc non Af Amer: 24 mL/min — ABNORMAL LOW (ref >60)
Glucose, Bld: 107 mg/dL — ABNORMAL HIGH (ref 65–99)
POTASSIUM: 4.5 mmol/L (ref 3.5–5.1)
SODIUM: 135 mmol/L (ref 135–145)

## 2015-08-13 MED ORDER — TRAMADOL HCL 50 MG PO TABS
50.0000 mg | ORAL_TABLET | Freq: Four times a day (QID) | ORAL | Status: DC | PRN
  Administered 2015-08-13: 50 mg via ORAL
  Filled 2015-08-13: qty 1

## 2015-08-13 NOTE — Progress Notes (Signed)
Subjective: 2 Days Post-Op Procedure(s) (LRB): ANTERIOR APPROACH HEMI HIP ARTHROPLASTY (Left) Patient reports pain as mild.  Taking by mouth okay. Some confusion which is normal for her.  Objective: Vital signs in last 24 hours: Temp:  [97.6 F (36.4 C)-98.4 F (36.9 C)] 97.9 F (36.6 C) (12/01 0418) Pulse Rate:  [87-91] 90 (12/01 0418) Resp:  [17-19] 19 (12/01 0418) BP: (109-147)/(40-70) 147/70 mmHg (12/01 0418) SpO2:  [94 %-97 %] 95 % (12/01 0418) Weight:  [51.2 kg (112 lb 14 oz)] 51.2 kg (112 lb 14 oz) (11/30 2059)  Intake/Output from previous day: 11/30 0701 - 12/01 0700 In: 2545 [P.O.:1080; I.V.:1465] Out: 1100 [Urine:1100] Intake/Output this shift:     Recent Labs  08/11/15 0936 08/12/15 1220 08/13/15 0550  HGB 12.0 9.3* 10.3*    Recent Labs  08/12/15 1220 08/13/15 0550  WBC 11.0* 11.0*  RBC 3.19* 3.48*  HCT 28.9* 31.5*  PLT 284 327    Recent Labs  08/12/15 0638 08/13/15 0550  NA 135 135  K 4.7 4.5  CL 103 106  CO2 20* 18*  BUN 36* 43*  CREATININE 2.00* 1.80*  GLUCOSE 145* 107*  CALCIUM 9.3 8.6*    Recent Labs  08/11/15 0936  INR 1.11   left hip exam: The left hip dressing has been removed. Staples are intact. No sign of infection. Her left calf is soft and nontender. Neurovascular status is intact to the left lower extremity. The patient is pleasantly confused.    Assessment/Plan: 2 Days Post-Op Procedure(s) (LRB): ANTERIOR APPROACH HEMI HIP ARTHROPLASTY (Left) Plan: Discontinue Foley today. Tramadol 50 mg one every 6-8 hours as needed for pain. Will discontinue Norco as it leads to confusion. Aspirin 325 mg twice daily with SCDs for DVT prophylaxis. Treat 1 month. Weight-bear as tolerated on left. Up with physical therapy. Hopefully to SNF tomorrow.   [place] G 08/13/2015, 8:17 AM

## 2015-08-13 NOTE — Progress Notes (Signed)
[facility] PROGRESS NOTE  Luellen Porteous [id number] DOB: 10-16-1926 DOA: 08/11/2015 PCP: No primary care provider on file.  Assessment/Plan: 1. L hip fracture -s/p HEMI HIP ARTHROPLASTY 11/29 -tolerated well, started on ASA BID by Ortho for DVT prophylaxis -stop IVF   2. Lung nodules highly suspicious for Metastatic disease, also has a rib lesion -d/w Son regarding workup-showed him CT scans, no plans to pursue workup -Palliative medicine consult appreciated, plan for SNF at discharge -DNR and focus on rehab  3. Hypertension.  -stable , held Avapro due to AKI -improving, stop IVF  4. AKI on CKD 3 -Baseline unknown. Cr 1.78 -hold Avapro, hydrate, monitor -bmet in am  5. Dementia -stable  6. Hypothyroidism.  -continue synthroid   CONSULTANTS: Orthopedics  Code Status: DNR Family Communication: none at bedside Disposition Plan: SNF tomorrow   Consultants:  Ortho  Palliative medicine  Procedures: HEMI HIP ARTHROPLASTY  11/29  HPI/Subjective: Some confusion this am  Objective: Filed Vitals:   08/13/15 0418 08/13/15 1039  BP: 147/70 148/76  Pulse: 90 92  Temp: 97.9 F (36.6 C) 98.6 F (37 C)  Resp: 19 20    Intake/Output Summary (Last 24 hours) at 08/13/15 1421 Last data filed at 08/13/15 1100  Gross per 24 hour  Intake   2065 ml  Output   2000 ml  Net     65 ml   Filed Weights   08/11/15 1709 08/11/15 2103 08/12/15 2059  Weight: 50.9 kg (112 lb 3.4 oz) 50.9 kg (112 lb 3.4 oz) 51.2 kg (112 lb 14 oz)    Exam:   General:  AAOx to self and place only, some confusion  Cardiovascular: S1S2/RRR  Respiratory: S1S2/RRR  Abdomen: soft, Nt, BS present  Musculoskeletal: no edema c/c,L hip with dressing c/d/i   Data Reviewed: Basic Metabolic Panel:  Recent Labs Lab 08/11/15 0936 08/12/15 0638 08/13/15 0550  NA 130* 135 135  K 4.5 4.7 4.5  CL 99* 103 106  CO2 21* 20* 18*  GLUCOSE 165* 145* 107*  BUN 42* 36* 43*  CREATININE  1.78* 2.00* 1.80*  CALCIUM 9.8 9.3 8.6*   Liver Function Tests: No results for input(s): AST, ALT, ALKPHOS, BILITOT, PROT, ALBUMIN in the last 168 hours. No results for input(s): LIPASE, AMYLASE in the last 168 hours. No results for input(s): AMMONIA in the last 168 hours. CBC:  Recent Labs Lab 08/11/15 0936 08/12/15 1220 08/13/15 0550  WBC 10.4 11.0* 11.0*  NEUTROABS 9.4*  --   --   HGB 12.0 9.3* 10.3*  HCT 35.5* 28.9* 31.5*  MCV 89.4 90.6 90.5  PLT 327 284 327   Cardiac Enzymes:  Recent Labs Lab 08/11/15 0936  CKTOTAL 288*  TROPONINI <0.03   BNP (last 3 results) No results for input(s): BNP in the last 8760 hours.  ProBNP (last 3 results) No results for input(s): PROBNP in the last 8760 hours.  CBG: No results for input(s): GLUCAP in the last 168 hours.  Recent Results (from the past 240 hour(s))  Surgical pcr screen     Status: None   Collection Time: 08/11/15  7:57 PM  Result Value Ref Range Status   MRSA, PCR NEGATIVE NEGATIVE Final   Staphylococcus aureus NEGATIVE NEGATIVE Final    Comment:        The Xpert SA Assay (FDA approved for NASAL specimens in patients over 7 years of age), is one component of a comprehensive surveillance program.  Test performance has been validated by [facility] for  patients greater than or equal to 53 year old. It is not intended to diagnose infection nor to guide or monitor treatment.      Studies: Dg Hip Operative Unilat With Pelvis Left  08/12/2015  CLINICAL DATA:  Status post left hip joint replacement following an acute fracture EXAM: OPERATIVE left HIP (WITH PELVIS IF PERFORMED) 2 VIEWS TECHNIQUE: Fluoroscopic spot image(s) were submitted for interpretation post-operatively. Reported fluoro time is 12 seconds COMPARISON:  Preoperative images of the left hip of August 11, 2015 FINDINGS: The patient has undergone placement of a left hip prosthesis for a subcapital fracture. Radiographic positioning of the  prosthetic components is good. The interface with the native bone appears normal. IMPRESSION: The patient has undergone left hip joint replacement without evidence of immediate postprocedure complication. Electronically Signed   By: David  Martinique M.D.   On: 08/12/2015 06:59    Scheduled Meds: . aspirin EC  325 mg Oral BID PC  . docusate sodium  100 mg Oral BID  . feeding supplement (ENSURE ENLIVE)  237 mL Oral BID BM  . ferrous sulfate  325 mg Oral Q breakfast  . levothyroxine  112 mcg Oral QAC breakfast   Continuous Infusions: . sodium chloride 20 mL (08/12/15 2126)   Antibiotics Given (last 72 hours)    Date/Time Action Medication Dose Rate   08/12/15 0836 Given   ceFAZolin (ANCEF) IVPB 2 g/50 mL premix 2 g 100 mL/hr      Active Problems:   Femoral neck fracture, left, closed, initial encounter   Hypertension   Acute kidney failure ([place])   Hypothermia   Hypothyroidism   Dementia   Metastatic disease ([place])   Left flank pain   Hip fracture ([place])   Encounter for palliative care    Time spent: 35min    Layan Zalenski  [facility] Pager [phone]. If 7PM-7AM, please contact night-coverage at [email or web address], password [facility] 08/13/2015, 2:21 PM  LOS: 2 days

## 2015-08-13 NOTE — Progress Notes (Signed)
Physical Therapy Treatment Patient Details Name: Janice Summers MRN: [id number] DOB: 1927-06-10 Today's Date: 08/13/2015    History of Present Illness 81 yo female in home fell trying to get to BR, now with hemiarthroplasty L hip with direct anterior approach, WBAT.  PMHx:  Dementia, osteoporosis, lung mets    PT Comments    Progressing towards goals, tolerated gait training for short distance in room with mod assist for balance, walker control, and to advance LLE with each step. Easily distracted but cooperative and willing to work with therapy as tolerated. Patient will continue to benefit from skilled physical therapy services to further improve independence with functional mobility.   Follow Up Recommendations  SNF     Equipment Recommendations  Rolling walker with 5" wheels    Recommendations for Other Services       Precautions / Restrictions Precautions Precautions: Fall Restrictions Weight Bearing Restrictions: Yes LLE Weight Bearing: Weight bearing as tolerated Other Position/Activity Restrictions: WBAT    Mobility  Bed Mobility Overal bed mobility: Needs Assistance Bed Mobility: Supine to Sit     Supine to sit: Mod assist     General bed mobility comments: Assist for LLE support out of bed and for patient to pull through PTs hand for support to EOB. Use of bed pad to scoot forward. Pt able to scoot back well without assist.  Transfers Overall transfer level: Needs assistance Equipment used: Rolling walker (2 wheeled) Transfers: Sit to/from Stand Sit to Stand: Mod assist         General transfer comment: Mod assist for boost to stand with support for posterior loss of balance. VC for upright posture and to press down through RW for support. performed x2.  Ambulation/Gait Ambulation/Gait assistance: Mod assist Ambulation Distance (Feet): 15 Feet Assistive device: Rolling walker (2 wheeled) Gait Pattern/deviations: Step-to pattern;Decreased step length -  right;Decreased step length - left;Decreased stance time - left;Decreased stride length;Decreased weight shift to left;Antalgic;Trunk flexed Gait velocity: slow Gait velocity interpretation: <1.8 ft/sec, indicative of risk for recurrent falls General Gait Details: Required physical assist for LLE advancement, and for balance due to posterior lean at times which progressively improved as distance increaseed. No buckling, but quite guarded with little weight-shift onto Lt. Good UE strength. Unsafe with removing hands from RW spontaneously requiring max VC for safety.   Stairs            Wheelchair Mobility    Modified Rankin (Stroke Patients Only)       Balance                                    Cognition Arousal/Alertness: Awake/alert Behavior During Therapy:  (confused but cooperative) Overall Cognitive Status: History of cognitive impairments - at baseline       Memory: Decreased recall of precautions;Decreased short-term memory              Exercises General Exercises - Lower Extremity Ankle Circles/Pumps: AAROM;Both;10 reps;Seated Long Arc Quad: AAROM;Left;10 reps;Seated    General Comments        Pertinent Vitals/Pain Pain Assessment: Faces Faces Pain Scale: Hurts little more Pain Location: LLE with movement Pain Descriptors / Indicators: Guarding;Grimacing Pain Intervention(s): Monitored during session;Repositioned    Home Living                      Prior Function  PT Goals (current goals can now be found in the care plan section) Acute Rehab PT Goals Patient Stated Goal: none stated PT Goal Formulation: With patient/family Time For Goal Achievement: 08/26/15 Potential to Achieve Goals: Good Progress towards PT goals: Progressing toward goals    Frequency  Min 3X/week    PT Plan Frequency needs to be updated (per dept guidelines, hemiarthroplasty with SNF destination)    Co-evaluation              End of Session Equipment Utilized During Treatment: Gait belt Activity Tolerance: Patient tolerated treatment well Patient left: in chair;with call bell/phone within reach;with chair alarm set     Time: [id number] PT Time Calculation (min) (ACUTE ONLY): 23 min  Charges:  $Gait Training: 8-22 mins $Therapeutic Activity: 8-22 mins                    G Codes:      Ellouise Newer 2015-09-04, 12:14 PM Camille Bal [place], [place]

## 2015-08-13 NOTE — Progress Notes (Signed)
Patient up most of the night. Seeing Cats and bugs. Reoriented patient no success.

## 2015-08-14 LAB — CBC
HCT: 28.2 % — ABNORMAL LOW (ref 36.0–46.0)
Hemoglobin: 9.1 g/dL — ABNORMAL LOW (ref 12.0–15.0)
MCH: 29.4 pg (ref 26.0–34.0)
MCHC: 32.3 g/dL (ref 30.0–36.0)
MCV: 91.3 fL (ref 78.0–100.0)
PLATELETS: 296 10*3/uL (ref 150–400)
RBC: 3.09 MIL/uL — ABNORMAL LOW (ref 3.87–5.11)
RDW: 12.9 % (ref 11.5–15.5)
WBC: 11.5 10*3/uL — AB (ref 4.0–10.5)

## 2015-08-14 LAB — BASIC METABOLIC PANEL
Anion gap: 8 (ref 5–15)
BUN: 38 mg/dL — AB (ref 6–20)
CHLORIDE: 109 mmol/L (ref 101–111)
CO2: 20 mmol/L — AB (ref 22–32)
CREATININE: 1.41 mg/dL — AB (ref 0.44–1.00)
Calcium: 8.5 mg/dL — ABNORMAL LOW (ref 8.9–10.3)
GFR calc Af Amer: 38 mL/min — ABNORMAL LOW (ref >60)
GFR calc non Af Amer: 33 mL/min — ABNORMAL LOW (ref >60)
GLUCOSE: 108 mg/dL — AB (ref 65–99)
Potassium: 4.7 mmol/L (ref 3.5–5.1)
SODIUM: 137 mmol/L (ref 135–145)

## 2015-08-14 MED ORDER — ASPIRIN EC 325 MG PO TBEC: 325.0000 mg | DELAYED_RELEASE_TABLET | Freq: Every day | ORAL | Status: AC

## 2015-08-14 MED ORDER — SENNA 8.6 MG PO TABS: 1.0000 | ORAL_TABLET | Freq: Every evening | ORAL | Status: AC | PRN

## 2015-08-14 NOTE — Progress Notes (Addendum)
Subjective: 3 Days Post-Op Procedure(s) (LRB): ANTERIOR APPROACH HEMI HIP ARTHROPLASTY (Left) Patient reports pain as mild.  Resting in bed. No complaints of hip pain.  Objective: Vital signs in last 24 hours: Temp:  [97.2 F (36.2 C)-98.5 F (36.9 C)] 98.5 F (36.9 C) (12/02 0950) Pulse Rate:  [79-95] 79 (12/02 0950) Resp:  [18-20] 18 (12/02 0950) BP: (93-153)/(71-75) 135/71 mmHg (12/02 0950) SpO2:  [96 %-98 %] 98 % (12/02 0950) Weight:  [51 kg (112 lb 7 oz)] 51 kg (112 lb 7 oz) (12/01 2042)  Intake/Output from previous day: 12/01 0701 - 12/02 0700 In: 2520 [P.O.:720; I.V.:1800] Out: 1750 [Urine:1750] Intake/Output this shift: Total I/O In: 480 [P.O.:480] Out: 425 [Urine:425]   Recent Labs  08/12/15 1220 08/13/15 0550 08/14/15 0628  HGB 9.3* 10.3* 9.1*    Recent Labs  08/13/15 0550 08/14/15 0628  WBC 11.0* 11.5*  RBC 3.48* 3.09*  HCT 31.5* 28.2*  PLT 327 296    Recent Labs  08/13/15 0550 08/14/15 0628  NA 135 137  K 4.5 4.7  CL 106 109  CO2 18* 20*  BUN 43* 38*  CREATININE 1.80* 1.41*  GLUCOSE 107* 108*  CALCIUM 8.6* 8.5*   No results for input(s): LABPT, INR in the last 72 hours. Left hip exam: Dressing clean and dry. No pain with motion of left hip. Calf is soft and nontender. Neurovascular status is intact distally. Distal pulses 1+.   Assessment/Plan: 3 Days Post-Op Procedure(s) (LRB): ANTERIOR APPROACH HEMI HIP ARTHROPLASTY (Left) Plan: Okay to discharge to skilled nursing facility tomorrow. Continue therapy in the meantime. Weight-bear as tolerated on left with no hip precautions. Tramadol 50 mg one to two every 6 hours as needed for pain. Enteric-coated aspirin 325 mg 1 daily with food for DVT prophylaxis 1 month postop. Follow-up with Dr. Berenice Primas in 2 weeks in the office.  Tareva Leske G 08/14/2015, 3:48 PM

## 2015-08-14 NOTE — Discharge Summary (Signed)
Physician Discharge Summary  Janice Summers [id number] DOB: 16-Sep-1926 DOA: 08/11/2015  PCP: No primary care provider on file.  Admit date: 08/11/2015 Discharge date: 08/14/2015  Time spent:45 minutes  Recommendations for Outpatient Follow-up:  1. Dr.John Graves 12/15 at 2pm  Discharge Diagnoses:  Active Problems:   Femoral neck fracture, left, closed, initial encounter   Hypertension   Acute kidney failure ()   Hypothermia   Hypothyroidism   Dementia   Metastatic disease   Lung metastasis   RIb metastasis   Left flank pain   Hip fracture ([place])   Encounter for palliative care   Discharge Condition: stable  Diet recommendation: low sodium  Filed Weights   08/11/15 2103 08/12/15 2059 08/13/15 2042  Weight: 50.9 kg (112 lb 3.4 oz) 51.2 kg (112 lb 14 oz) 51 kg (112 lb 7 oz)    History of present illness:  CHIEF COMPLAINT: hip fracture after fall   HPI: Janice Summers is a 81 y.o. female brought to emergency department 11/29 after a fall at home. Patient's son found her on the floor around 7:30 am. Patient has dementia, does not remember any details surrounding the event. Per son, it appeared that patient was trying to get to the bathroom and tripped on her underwear .  Patient developed left flank pain a few days prior. PCP checked urine which apparently contained blood. Cipro was prescribed, patient had 2 doses thus far. She had an outpatient chest x-ray a few days ago suggested metastatic disease, staging CT scans was to be ordered by PCP  Hospital Course:  1. L hip fracture -s/p HEMI HIP ARTHROPLASTY 11/29 -tolerated well, started on ASA BID by Ortho for DVT prophylaxis -stable, post operatively -FU with Dr.Graves on 12/15, SNF for rehab  2. Lung nodules highly suspicious for Metastatic disease, also has a rib lesion-metastatic -d/w Son regarding workup-showed him CT scans, no plans to pursue workup, biopsy/RX etc -Palliative medicine consulted, No workup  confirmed, focus on current Hip Fx and Rehab, DNR, MOST Form filled  3. Hypertension.  -stable , held Avapro due to AKI initially -then resumed  4. AKI on CKD 3 -Baseline unknown. Cr 1.78 -hydrated held Avapro initially, resumed at discharge, creatinine 1.4 today  5. Dementia -stable, some confusion noted  6. Hypothyroidism.  -continue synthroid   Procedures: HEMI HIP ARTHROPLASTY 11/29  Consultations:  Ortho  Palliative care  Discharge Exam: Filed Vitals:   08/14/15 0534 08/14/15 0950  BP: 153/71 135/71  Pulse: 84 79  Temp: 98.2 F (36.8 C) 98.5 F (36.9 C)  Resp: 20 18    General: AAOx3 Cardiovascular: S1S2/RRR Respiratory: CTAB  Discharge Instructions   Discharge Instructions    Diet - low sodium heart healthy    Complete by:  As directed      Increase activity slowly    Complete by:  As directed      Weight bearing as tolerated    Complete by:  As directed   Laterality:  left  Extremity:  Lower          Current Discharge Medication List    START taking these medications   Details  aspirin EC 325 MG tablet Take 1 tablet (325 mg total) by mouth 2 (two) times daily after a meal. Take x 1 month post op to decrease risk of blood clots. Qty: 60 tablet, Refills: 0    senna (SENOKOT) 8.6 MG TABS tablet Take 1 tablet (8.6 mg total) by mouth at bedtime as needed for mild  constipation. Qty: 15 each, Refills: 0    traMADol (ULTRAM) 50 MG tablet Take 1-2 tablets (50-100 mg total) by mouth every 6 (six) hours as needed for moderate pain. Qty: 50 tablet, Refills: 0      CONTINUE these medications which have NOT CHANGED   Details  irbesartan (AVAPRO) 75 MG tablet Take 75 mg by mouth daily.    levothyroxine (SYNTHROID, LEVOTHROID) 112 MCG tablet Take 112 mcg by mouth daily before breakfast.      STOP taking these medications     ciprofloxacin (CIPRO) 500 MG tablet      vitamin B-12 (CYANOCOBALAMIN) 100 MCG tablet      vitamin E 100 UNIT capsule         No Known Allergies Follow-up Information    Follow up with GRAVES,JOHN L, MD. Schedule an appointment as soon as possible for a visit in 2 weeks.   Specialty:  Orthopedic Surgery   Why:  APPOINTMENT:  Thursday, 08-27-15 @ 2:15 pm   Contact information:   [place] [address] [phone]        The results of significant diagnostics from this hospitalization (including imaging, microbiology, ancillary and laboratory) are listed below for reference.    Significant Diagnostic Studies: Dg Chest 1 View  08/11/2015  CLINICAL DATA:  Fall EXAM: CHEST 1 VIEW COMPARISON:  None. FINDINGS: Numerous lung nodules are present throughout both lungs. Most of the nodules are under 1 cm. Extrapleural mass in the right upper lobe related to a rib lesion with adjacent soft tissue mass. Heart size normal. Negative for heart failure. Negative for pneumonia or effusion. IMPRESSION: Widespread lung nodules consistent with metastatic disease. Rib lesion right third rib with adjacent soft tissue extrapleural mass. This is consistent with metastatic disease. Electronically Signed   By: Franchot Gallo M.D.   On: 08/11/2015 11:43   Ct Head Wo Contrast  08/11/2015  CLINICAL DATA:  Fall last night.  Found on floor EXAM: CT HEAD WITHOUT CONTRAST CT CERVICAL SPINE WITHOUT CONTRAST TECHNIQUE: Multidetector CT imaging of the head and cervical spine was performed following the standard protocol without intravenous contrast. Multiplanar CT image reconstructions of the cervical spine were also generated. COMPARISON:  03/09/2015 FINDINGS: CT HEAD FINDINGS Generalized atrophy with mild chronic microvascular ischemic change in the white matter. Negative for acute infarct.  Negative for hemorrhage or mass Negative for skull fracture. CT CERVICAL SPINE FINDINGS Mild anterior slip C4-5 with disc and facet degeneration. Moderate disc degeneration and spondylosis C5-6 and C6-7. Negative for fracture.  No bony mass  lesion. Carotid artery calcification. Numerous lung nodules bilaterally, concerning for metastatic disease. IMPRESSION: Atrophy and chronic microvascular ischemia. No acute intracranial abnormality Cervical spine degenerative change.  Negative for fracture Numerous lung nodules, consistent with metastatic disease. Electronically Signed   By: Franchot Gallo M.D.   On: 08/11/2015 11:27   Ct Cervical Spine Wo Contrast  08/11/2015  CLINICAL DATA:  Fall last night.  Found on floor EXAM: CT HEAD WITHOUT CONTRAST CT CERVICAL SPINE WITHOUT CONTRAST TECHNIQUE: Multidetector CT imaging of the head and cervical spine was performed following the standard protocol without intravenous contrast. Multiplanar CT image reconstructions of the cervical spine were also generated. COMPARISON:  03/09/2015 FINDINGS: CT HEAD FINDINGS Generalized atrophy with mild chronic microvascular ischemic change in the white matter. Negative for acute infarct.  Negative for hemorrhage or mass Negative for skull fracture. CT CERVICAL SPINE FINDINGS Mild anterior slip C4-5 with disc and facet degeneration. Moderate disc degeneration  and spondylosis C5-6 and C6-7. Negative for fracture.  No bony mass lesion. Carotid artery calcification. Numerous lung nodules bilaterally, concerning for metastatic disease. IMPRESSION: Atrophy and chronic microvascular ischemia. No acute intracranial abnormality Cervical spine degenerative change.  Negative for fracture Numerous lung nodules, consistent with metastatic disease. Electronically Signed   By: Franchot Gallo M.D.   On: 08/11/2015 11:27   Dg Hip Operative Unilat With Pelvis Left  08/12/2015  CLINICAL DATA:  Status post left hip joint replacement following an acute fracture EXAM: OPERATIVE left HIP (WITH PELVIS IF PERFORMED) 2 VIEWS TECHNIQUE: Fluoroscopic spot image(s) were submitted for interpretation post-operatively. Reported fluoro time is 12 seconds COMPARISON:  Preoperative images of the left hip  of August 11, 2015 FINDINGS: The patient has undergone placement of a left hip prosthesis for a subcapital fracture. Radiographic positioning of the prosthetic components is good. The interface with the native bone appears normal. IMPRESSION: The patient has undergone left hip joint replacement without evidence of immediate postprocedure complication. Electronically Signed   By: David  Martinique M.D.   On: 08/12/2015 06:59   Dg Hip Unilat With Pelvis 2-3 Views Left  08/11/2015  CLINICAL DATA:  Pain following fall EXAM: DG HIP (WITH OR WITHOUT PELVIS) 2-3V LEFT COMPARISON:  None. FINDINGS: Frontal pelvis as well as frontal and lateral left hip images were obtained. There is a subcapital femoral neck fracture on the left with varus angulation at fracture site. There is mild superior migration of the left femoral shaft. No other fractures. No dislocation. There is mild symmetric narrowing of both hip joints. Bones appear somewhat osteoporotic. IMPRESSION: Subcapital femoral neck fracture on the left with superior migration of the femoral shaft and varus angulation of the fracture site. No dislocation. Bones osteoporotic. Mild symmetric narrowing both hip joints. Electronically Signed   By: Lowella Grip III M.D.   On: 08/11/2015 11:47    Microbiology: Recent Results (from the past 240 hour(s))  Surgical pcr screen     Status: None   Collection Time: 08/11/15  7:57 PM  Result Value Ref Range Status   MRSA, PCR NEGATIVE NEGATIVE Final   Staphylococcus aureus NEGATIVE NEGATIVE Final    Comment:        The Xpert SA Assay (FDA approved for NASAL specimens in patients over 20 years of age), is one component of a comprehensive surveillance program.  Test performance has been validated by [facility] for patients greater than or equal to 14 year old. It is not intended to diagnose infection nor to guide or monitor treatment.      Labs: Basic Metabolic Panel:  Recent Labs Lab 08/11/15 0936  08/12/15 [id number] 08/13/15 0550 08/14/15 0628  NA 130* 135 135 137  K 4.5 4.7 4.5 4.7  CL 99* 103 106 109  CO2 21* 20* 18* 20*  GLUCOSE 165* 145* 107* 108*  BUN 42* 36* 43* 38*  CREATININE 1.78* 2.00* 1.80* 1.41*  CALCIUM 9.8 9.3 8.6* 8.5*   Liver Function Tests: No results for input(s): AST, ALT, ALKPHOS, BILITOT, PROT, ALBUMIN in the last 168 hours. No results for input(s): LIPASE, AMYLASE in the last 168 hours. No results for input(s): AMMONIA in the last 168 hours. CBC:  Recent Labs Lab 08/11/15 0936 08/12/15 1220 08/13/15 0550 08/14/15 0628  WBC 10.4 11.0* 11.0* 11.5*  NEUTROABS 9.4*  --   --   --   HGB 12.0 9.3* 10.3* 9.1*  HCT 35.5* 28.9* 31.5* 28.2*  MCV 89.4 90.6 90.5 91.3  PLT  327 284 327 296   Cardiac Enzymes:  Recent Labs Lab 08/11/15 0936  CKTOTAL 288*  TROPONINI <0.03   BNP: BNP (last 3 results) No results for input(s): BNP in the last 8760 hours.  ProBNP (last 3 results) No results for input(s): PROBNP in the last 8760 hours.  CBG: No results for input(s): GLUCAP in the last 168 hours.     SignedDomenic Polite  [facility] 08/14/2015, 12:14 PM

## 2015-08-14 NOTE — Clinical Social Work Placement (Signed)
   CLINICAL SOCIAL WORK PLACEMENT  NOTE 08/14/15 - DISCHARGED TO [facility]  Date:  08/14/2015  Patient Details  Name: Janice Summers MRN: [id number] Date of Birth: 1926-11-27  Clinical Social Work is seeking post-discharge placement for this patient at the [place] level of care (*CSW will initial, date and re-position this form in  chart as items are completed):  Yes   Patient/family provided with [place] Work Department's list of facilities offering this level of care within the geographic area requested by the patient (or if unable, by the patient's family).  Yes   Patient/family informed of their freedom to choose among providers that offer the needed level of care, that participate in Medicare, Medicaid or managed care program needed by the patient, have an available bed and are willing to accept the patient.  Yes   Patient/family informed of [facility]'s ownership interest in [facility] and [facility], as well as of the fact that they are under no obligation to receive care at these facilities.  PASRR submitted to EDS on 08/12/15     PASRR number received on 08/12/15     Existing PASRR number confirmed on       FL2 transmitted to all facilities in geographic area requested by pt/family on 08/12/15     FL2 transmitted to all facilities within larger geographic area on       Patient informed that his/her managed care company has contracts with or will negotiate with certain facilities, including the following:         Yes - Patient/family informed of bed offers received.  Patient chooses bed at  [facility]     Physician recommends and patient chooses bed at      Patient to be transferred to  [facility] on  08/14/15.  Patient to be transferred to facility by  ambulance     Patient family notified on  08/14/15 of transfer.  Name of family member notified:   Son, Joan Flores    PHYSICIAN       Additional Comment:     _______________________________________________ Sable Feil, LCSW 08/14/2015, 2:06 PM

## 2015-08-14 NOTE — Care Management Note (Signed)
Case Management Note  Patient Details  Name: Janice Summers MRN: [id number] Date of Birth: Aug 03, 1927  Subjective/Objective:                    Action/Plan:Discharge to [facility] today. No further CM needs. Will sign off.    Expected Discharge Date:                  Expected Discharge Plan:  Skilled Nursing Facility  In-House Referral:  Clinical Social Work  Discharge planning Services  CM Consult  Post Acute Care Choice:    Choice offered to:     DME Arranged:    DME Agency:     HH Arranged:    [place] Agency:     Status of Service:  Completed, signed off  Medicare Important Message Given:    Date Medicare IM Given:    Medicare IM give by:    Date Additional Medicare IM Given:    Additional Medicare Important Message give by:     If discussed at [place] of Stay Meetings, dates discussed:    Additional Comments:  Delrae Sawyers, RN 08/14/2015, 11:40 AM

## 2015-08-14 NOTE — Discharge Instructions (Signed)
INSTRUCTIONS AFTER JOINT REPLACEMENT   o Remove items at home which could result in a fall. This includes throw rugs or furniture in walking pathways o ICE to the affected joint every three hours while awake for 30 minutes at a time, for at least the first 3-5 days, and then as needed for pain and swelling.  Continue to use ice for pain and swelling. You may notice swelling that will progress down to the foot and ankle.  This is normal after surgery.  Elevate your leg when you are not up walking on it.   o Continue to use the breathing machine you got in the hospital (incentive spirometer) which will help keep your temperature down.  It is common for your temperature to cycle up and down following surgery, especially at night when you are not up moving around and exerting yourself.  The breathing machine keeps your lungs expanded and your temperature down.   DIET:  As you were doing prior to hospitalization, we recommend a well-balanced diet.  DRESSING / WOUND CARE / SHOWERING  Keep the hip dressing clean and dry. Please change the dressing with a dry dressing every 5 days.  ACTIVITY  o Increase activity slowly as tolerated, but follow the weight bearing instructions below.   o No driving for 6 weeks or until further direction given by your physician.  You cannot drive while taking narcotics.  o No lifting or carrying greater than 10 lbs. until further directed by your surgeon. o Avoid periods of inactivity such as sitting longer than an hour when not asleep. This helps prevent blood clots.  o You may return to work once you are authorized by your doctor.     WEIGHT BEARING   Weight bearing as tolerated with assist device (walker, cane, etc) as directed, use it as long as suggested by your surgeon or therapist, typically at least 4-6 weeks.   EXERCISES  Results after joint replacement surgery are often greatly improved when you follow the exercise, range of motion and muscle  strengthening exercises prescribed by your doctor. Safety measures are also important to protect the joint from further injury. Any time any of these exercises cause you to have increased pain or swelling, decrease what you are doing until you are comfortable again and then slowly increase them. If you have problems or questions, call your caregiver or physical therapist for advice.   Rehabilitation is important following a joint replacement. After just a few days of immobilization, the muscles of the leg can become weakened and shrink (atrophy).  These exercises are designed to build up the tone and strength of the thigh and leg muscles and to improve motion. Often times heat used for twenty to thirty minutes before working out will loosen up your tissues and help with improving the range of motion but do not use heat for the first two weeks following surgery (sometimes heat can increase post-operative swelling).   These exercises can be done on a training (exercise) mat, on the floor, on a table or on a bed. Use whatever works the best and is most comfortable for you.    Use music or television while you are exercising so that the exercises are a pleasant break in your day. This will make your life better with the exercises acting as a break in your routine that you can look forward to.   Perform all exercises about fifteen times, three times per day or as directed.  You should exercise  both the operative leg and the other leg as well.  Exercises include:    Quad Sets - Tighten up the muscle on the front of the thigh (Quad) and hold for 5-10 seconds.    Straight Leg Raises - With your knee straight (if you were given a brace, keep it on), lift the leg to 60 degrees, hold for 3 seconds, and slowly lower the leg.  Perform this exercise against resistance later as your leg gets stronger.   Leg Slides: Lying on your back, slowly slide your foot toward your buttocks, bending your knee up off the floor  (only go as far as is comfortable). Then slowly slide your foot back down until your leg is flat on the floor again.   Angel Wings: Lying on your back spread your legs to the side as far apart as you can without causing discomfort.   Hamstring Strength:  Lying on your back, push your heel against the floor with your leg straight by tightening up the muscles of your buttocks.  Repeat, but this time bend your knee to a comfortable angle, and push your heel against the floor.  You may put a pillow under the heel to make it more comfortable if necessary.   A rehabilitation program following joint replacement surgery can speed recovery and prevent re-injury in the future due to weakened muscles. Contact your doctor or a physical therapist for more information on knee rehabilitation.    CONSTIPATION  Constipation is defined medically as fewer than three stools per week and severe constipation as less than one stool per week.  Even if you have a regular bowel pattern at home, your normal regimen is likely to be disrupted due to multiple reasons following surgery.  Combination of anesthesia, postoperative narcotics, change in appetite and fluid intake all can affect your bowels.   YOU MUST use at least one of the following options; they are listed in order of increasing strength to get the job done.  They are all available over the counter, and you may need to use some, POSSIBLY even all of these options:    Drink plenty of fluids (prune juice may be helpful) and high fiber foods Colace 100 mg by mouth twice a day  Senokot for constipation as directed and as needed Dulcolax (bisacodyl), take with full glass of water  Miralax (polyethylene glycol) once or twice a day as needed.  If you have tried all these things and are unable to have a bowel movement in the first 3-4 days after surgery call either your surgeon or your primary doctor.    If you experience loose stools or diarrhea, hold the medications  until you stool forms back up.  If your symptoms do not get better within 1 week or if they get worse, check with your doctor.  If you experience "the worst abdominal pain ever" or develop nausea or vomiting, please contact the office immediately for further recommendations for treatment.   ITCHING:  If you experience itching with your medications, try taking only a single pain pill, or even half a pain pill at a time.  You can also use Benadryl over the counter for itching or also to help with sleep.   TED HOSE STOCKINGS:  Use stockings on both legs until for at least 2 weeks or as directed by physician office. They may be removed at night for sleeping.  MEDICATIONS:  See your medication summary on the After Visit Summary that nursing will review  with you.  You may have some home medications which will be placed on hold until you complete the course of blood thinner medication.  It is important for you to complete the blood thinner medication as prescribed.  PRECAUTIONS:  If you experience chest pain or shortness of breath - call 911 immediately for transfer to the hospital emergency department.   If you develop a fever greater that 101 F, purulent drainage from wound, increased redness or drainage from wound, foul odor from the wound/dressing, or calf pain - CONTACT YOUR SURGEON.                                                   FOLLOW-UP APPOINTMENTS:  If you do not already have a post-op appointment, please call the office for an appointment to be seen by your surgeon.  Guidelines for how soon to be seen are listed in your After Visit Summary, but are typically between 1-4 weeks after surgery.  MAKE SURE YOU:   Understand these instructions.   Get help right away if you are not doing well or get worse.    Thank you for letting us be a part of your medical care team.  It is a privilege we respect greatly.  We hope these instructions will help you stay on track for a fast and full  recovery!

## 2015-08-14 NOTE — Care Management Important Message (Signed)
Important Message  Patient Details  Name: Janice Summers MRN: [id number] Date of Birth: [birth year]   Medicare Important Message Given:  Yes    Harriet Sutphen P [place] 08/14/2015, 2:22 PM

## 2015-08-17 ENCOUNTER — Encounter: Payer: Self-pay | Admitting: Adult Health

## 2015-08-17 ENCOUNTER — Non-Acute Institutional Stay (SKILLED_NURSING_FACILITY): Payer: Medicare Other | Admitting: Adult Health

## 2015-08-17 DIAGNOSIS — R911 Solitary pulmonary nodule: Secondary | ICD-10-CM | POA: Diagnosis not present

## 2015-08-17 DIAGNOSIS — D62 Acute posthemorrhagic anemia: Secondary | ICD-10-CM | POA: Diagnosis not present

## 2015-08-17 DIAGNOSIS — C799 Secondary malignant neoplasm of unspecified site: Secondary | ICD-10-CM

## 2015-08-17 DIAGNOSIS — S72002S Fracture of unspecified part of neck of left femur, sequela: Secondary | ICD-10-CM

## 2015-08-17 DIAGNOSIS — K5900 Constipation, unspecified: Secondary | ICD-10-CM

## 2015-08-17 DIAGNOSIS — F0390 Unspecified dementia without behavioral disturbance: Secondary | ICD-10-CM | POA: Diagnosis not present

## 2015-08-17 DIAGNOSIS — I10 Essential (primary) hypertension: Secondary | ICD-10-CM

## 2015-08-17 DIAGNOSIS — E039 Hypothyroidism, unspecified: Secondary | ICD-10-CM

## 2015-08-17 DIAGNOSIS — N183 Chronic kidney disease, stage 3 unspecified: Secondary | ICD-10-CM

## 2015-08-17 DIAGNOSIS — C801 Malignant (primary) neoplasm, unspecified: Secondary | ICD-10-CM | POA: Diagnosis not present

## 2015-08-18 LAB — BASIC METABOLIC PANEL
BUN: 36 mg/dL — AB (ref 4–21)
Creatinine: 1.4 mg/dL — AB (ref 0.5–1.1)
GLUCOSE: 87 mg/dL
Potassium: 4.4 mmol/L (ref 3.4–5.3)
SODIUM: 138 mmol/L (ref 137–147)

## 2015-08-18 LAB — CBC AND DIFFERENTIAL
HEMATOCRIT: 26 % — AB (ref 36–46)
HEMOGLOBIN: 8.1 g/dL — AB (ref 12.0–16.0)
PLATELETS: 354 10*3/uL (ref 150–399)
WBC: 7.7 10^3/mL

## 2015-08-19 ENCOUNTER — Encounter: Payer: Self-pay | Admitting: Internal Medicine

## 2015-08-19 ENCOUNTER — Non-Acute Institutional Stay (SKILLED_NURSING_FACILITY): Payer: Medicare Other | Admitting: Internal Medicine

## 2015-08-19 DIAGNOSIS — E039 Hypothyroidism, unspecified: Secondary | ICD-10-CM

## 2015-08-19 DIAGNOSIS — I10 Essential (primary) hypertension: Secondary | ICD-10-CM | POA: Diagnosis not present

## 2015-08-19 DIAGNOSIS — S72002S Fracture of unspecified part of neck of left femur, sequela: Secondary | ICD-10-CM

## 2015-08-19 DIAGNOSIS — D649 Anemia, unspecified: Secondary | ICD-10-CM

## 2015-08-19 DIAGNOSIS — F0390 Unspecified dementia without behavioral disturbance: Secondary | ICD-10-CM

## 2015-08-19 DIAGNOSIS — D6489 Other specified anemias: Secondary | ICD-10-CM

## 2015-08-19 DIAGNOSIS — R2681 Unsteadiness on feet: Secondary | ICD-10-CM

## 2015-08-19 DIAGNOSIS — R5381 Other malaise: Secondary | ICD-10-CM | POA: Diagnosis not present

## 2015-08-19 DIAGNOSIS — C799 Secondary malignant neoplasm of unspecified site: Secondary | ICD-10-CM

## 2015-08-19 DIAGNOSIS — C801 Malignant (primary) neoplasm, unspecified: Secondary | ICD-10-CM

## 2015-08-19 DIAGNOSIS — N183 Chronic kidney disease, stage 3 (moderate): Secondary | ICD-10-CM

## 2015-08-19 NOTE — Progress Notes (Signed)
Patient ID: Janice Summers, female   DOB: [birth year], 81 y.o.   MRN: [id number]     [place]  PCP: No primary care provider on file.  Code Status: DNR  No Known Allergies  Chief Complaint  Patient presents with  . New Admit To SNF    New Admission      HPI:  81 y.o. patient is here for short term rehabilitation post hospital admission from 08/11/15-08/14/15 with left femoral neck fracture post fall. She underwent left hip hemiarthroplasty. She has PMH of thyroid disease, HTN, dementia. She is seen in her room today. She is alert and oriented to person only. Denies being in pain at present.    Review of Systems:  Constitutional: Negative for fever HENT: Negative for headache, congestion, nasal discharge Eyes: Negative for blurred vision, double vision and discharge.  Respiratory: Negative for cough, shortness of breath and wheezing.   Cardiovascular: Negative for chest pain, palpitations, leg swelling.  Gastrointestinal: Negative for heartburn, nausea, vomiting, abdominal pain Genitourinary: Negative for dysuria  Musculoskeletal: Negative for fall in the facility Skin: Negative for itching, rash.  Neurological: Negative for dizziness   Past Medical History  Diagnosis Date  . Thyroid disease   . Dementia    Past Surgical History  Procedure Laterality Date  . Anterior approach hemi hip arthroplasty Left 08/11/2015    Procedure: ANTERIOR APPROACH HEMI HIP ARTHROPLASTY;  Surgeon: Dorna Leitz, MD;  Location: [facility];  Service: Orthopedics;  Laterality: Left;   Social History:   reports that she has never smoked. She does not have any smokeless tobacco history on file. She reports that she does not drink alcohol or use illicit drugs.  History reviewed. No pertinent family history.  Medications:   Medication List       This list is accurate as of: 08/19/15 10:33 AM.  Always use your most recent med list.               aspirin EC 325 MG tablet  Take 1  tablet (325 mg total) by mouth daily after breakfast. Take x 1 month post op to decrease risk of blood clots.     irbesartan 75 MG tablet  Commonly known as:  AVAPRO  Take 75 mg by mouth daily.     levothyroxine 112 MCG tablet  Commonly known as:  SYNTHROID, LEVOTHROID  Take 112 mcg by mouth daily before breakfast.     senna 8.6 MG Tabs tablet  Commonly known as:  SENOKOT  Take 1 tablet (8.6 mg total) by mouth at bedtime as needed for mild constipation.     traMADol 50 MG tablet  Commonly known as:  ULTRAM  Take 1-2 tablets (50-100 mg total) by mouth every 6 (six) hours as needed for moderate pain.         Physical Exam: Filed Vitals:   08/19/15 1027  BP: 118/70  Pulse: 72  Temp: 97 F (36.1 C)  TempSrc: Oral  Resp: 18  Height: 5\' 4"  (1.626 m)  Weight: 112 lb (50.803 kg)  SpO2: 95%    General- elderly female, thin built, frail, in no acute distress Head- normocephalic, atraumatic Nose- no nasal discharge Throat- moist mucus membrane Eyes- PERRLA, EOMI, no pallor, no icterus, no discharge, normal conjunctiva, normal sclera Neck- no cervical lymphadenopathy Cardiovascular- normal s1,s2, no murmur, left leg 1+ edema Respiratory- bilateral clear to auscultation Abdomen- bowel sounds present, soft, non tender Musculoskeletal- able to move all 4 extremities, limited left leg  range of motion  Neurological- alert and oriented to person only Skin- warm and dry, clean dressing over left hip surgical incision   Labs reviewed: Basic Metabolic Panel:  Recent Labs  08/12/15 0638 08/13/15 0550 08/14/15 0628 08/18/15  NA 135 135 137 138  K 4.7 4.5 4.7 4.4  CL 103 106 109  --   CO2 20* 18* 20*  --   GLUCOSE 145* 107* 108*  --   BUN 36* 43* 38* 36*  CREATININE 2.00* 1.80* 1.41* 1.4*  CALCIUM 9.3 8.6* 8.5*  --    CBC:  Recent Labs  08/11/15 0936 08/12/15 1220 08/13/15 0550 08/14/15 0628 08/18/15  WBC 10.4 11.0* 11.0* 11.5* 7.7  NEUTROABS 9.4*  --   --   --    --   HGB 12.0 9.3* 10.3* 9.1* 8.1*  HCT 35.5* 28.9* 31.5* 28.2* 26*  MCV 89.4 90.6 90.5 91.3  --   PLT 327 284 327 296 354   Cardiac Enzymes:  Recent Labs  08/11/15 0936  CKTOTAL 288*  TROPONINI <0.03   BNP: Invalid input(s): POCBNP CBG: No results for input(s): GLUCAP in the last 8760 hours.  Radiological Exams: Dg Chest 1 View  08/11/2015  CLINICAL DATA:  Fall EXAM: CHEST 1 VIEW COMPARISON:  None. FINDINGS: Numerous lung nodules are present throughout both lungs. Most of the nodules are under 1 cm. Extrapleural mass in the right upper lobe related to a rib lesion with adjacent soft tissue mass. Heart size normal. Negative for heart failure. Negative for pneumonia or effusion. IMPRESSION: Widespread lung nodules consistent with metastatic disease. Rib lesion right third rib with adjacent soft tissue extrapleural mass. This is consistent with metastatic disease. Electronically Signed   By: Franchot Gallo M.D.   On: 08/11/2015 11:43   Ct Head Wo Contrast  08/11/2015  CLINICAL DATA:  Fall last night.  Found on floor EXAM: CT HEAD WITHOUT CONTRAST CT CERVICAL SPINE WITHOUT CONTRAST TECHNIQUE: Multidetector CT imaging of the head and cervical spine was performed following the standard protocol without intravenous contrast. Multiplanar CT image reconstructions of the cervical spine were also generated. COMPARISON:  03/09/2015 FINDINGS: CT HEAD FINDINGS Generalized atrophy with mild chronic microvascular ischemic change in the white matter. Negative for acute infarct.  Negative for hemorrhage or mass Negative for skull fracture. CT CERVICAL SPINE FINDINGS Mild anterior slip C4-5 with disc and facet degeneration. Moderate disc degeneration and spondylosis C5-6 and C6-7. Negative for fracture.  No bony mass lesion. Carotid artery calcification. Numerous lung nodules bilaterally, concerning for metastatic disease. IMPRESSION: Atrophy and chronic microvascular ischemia. No acute intracranial  abnormality Cervical spine degenerative change.  Negative for fracture Numerous lung nodules, consistent with metastatic disease. Electronically Signed   By: Franchot Gallo M.D.   On: 08/11/2015 11:27   Ct Cervical Spine Wo Contrast  08/11/2015  CLINICAL DATA:  Fall last night.  Found on floor EXAM: CT HEAD WITHOUT CONTRAST CT CERVICAL SPINE WITHOUT CONTRAST TECHNIQUE: Multidetector CT imaging of the head and cervical spine was performed following the standard protocol without intravenous contrast. Multiplanar CT image reconstructions of the cervical spine were also generated. COMPARISON:  03/09/2015 FINDINGS: CT HEAD FINDINGS Generalized atrophy with mild chronic microvascular ischemic change in the white matter. Negative for acute infarct.  Negative for hemorrhage or mass Negative for skull fracture. CT CERVICAL SPINE FINDINGS Mild anterior slip C4-5 with disc and facet degeneration. Moderate disc degeneration and spondylosis C5-6 and C6-7. Negative for fracture.  No bony mass lesion. Carotid artery  calcification. Numerous lung nodules bilaterally, concerning for metastatic disease. IMPRESSION: Atrophy and chronic microvascular ischemia. No acute intracranial abnormality Cervical spine degenerative change.  Negative for fracture Numerous lung nodules, consistent with metastatic disease. Electronically Signed   By: Franchot Gallo M.D.   On: 08/11/2015 11:27   Dg Hip Operative Unilat With Pelvis Left  08/12/2015  CLINICAL DATA:  Status post left hip joint replacement following an acute fracture EXAM: OPERATIVE left HIP (WITH PELVIS IF PERFORMED) 2 VIEWS TECHNIQUE: Fluoroscopic spot image(s) were submitted for interpretation post-operatively. Reported fluoro time is 12 seconds COMPARISON:  Preoperative images of the left hip of August 11, 2015 FINDINGS: The patient has undergone placement of a left hip prosthesis for a subcapital fracture. Radiographic positioning of the prosthetic components is good. The  interface with the native bone appears normal. IMPRESSION: The patient has undergone left hip joint replacement without evidence of immediate postprocedure complication. Electronically Signed   By: David  Martinique M.D.   On: 08/12/2015 06:59   Dg Hip Unilat With Pelvis 2-3 Views Left  08/11/2015  CLINICAL DATA:  Pain following fall EXAM: DG HIP (WITH OR WITHOUT PELVIS) 2-3V LEFT COMPARISON:  None. FINDINGS: Frontal pelvis as well as frontal and lateral left hip images were obtained. There is a subcapital femoral neck fracture on the left with varus angulation at fracture site. There is mild superior migration of the left femoral shaft. No other fractures. No dislocation. There is mild symmetric narrowing of both hip joints. Bones appear somewhat osteoporotic. IMPRESSION: Subcapital femoral neck fracture on the left with superior migration of the femoral shaft and varus angulation of the fracture site. No dislocation. Bones osteoporotic. Mild symmetric narrowing both hip joints. Electronically Signed   By: Lowella Grip III M.D.   On: 08/11/2015 11:47    Assessment/Plan  Unsteady gait Post left femoral neck fracture. Will have patient work with PT/OT as tolerated to regain strength and restore function.  Fall precautions are in place.  Physical deconditioning Will have her work with physical therapy and occupational therapy team to help with gait training and muscle strengthening exercises.fall precautions. Skin care. Encourage to be out of bed. Patient would like palliative care consult- will provide one  Left hip fracture  S/P hemiarthroplasty. To work with therapy team for gait training and transfers. Continue tramadol 50 mg 1-2 tab q6h prn pain and aspirin 325 mg bid for dvt prophylaxis. Has f/u with orthopedics. LLE WBAT.    HTN Stable bp, continue irbesartan and monitor BP  Anemia With recent surgery causing blood loss and anemia of chronic disease. Hb reviewed and has drop in hb level.  Monitor clinically for now  ckd 3 Reviewed bmp, monitor  Metastatic disease with primary source unidentified Has lung nodules. Comfort care is the goal. Get palliative care consult  Dementia  Pleasantly confused. No behavioral issue. Monitor. Provide assistance with her ADLs, pressure ulcer prophylaxis  Hypothyroidism Continue synthroid   Goals of care: short term rehabilitation   Family/ staff Communication: reviewed care plan with patient and nursing supervisor    Blanchie Serve, MD  [facility] [phone] (Monday-Friday 8 am - 5 pm) [phone] (afterhours)

## 2015-08-20 ENCOUNTER — Inpatient Hospital Stay: Admission: RE | Admit: 2015-08-20 | Payer: Medicare Other | Source: Ambulatory Visit

## 2015-08-21 ENCOUNTER — Encounter: Payer: Self-pay | Admitting: Adult Health

## 2015-08-21 ENCOUNTER — Non-Acute Institutional Stay (SKILLED_NURSING_FACILITY): Payer: Medicare Other | Admitting: Adult Health

## 2015-08-21 DIAGNOSIS — D62 Acute posthemorrhagic anemia: Secondary | ICD-10-CM | POA: Diagnosis not present

## 2015-08-21 DIAGNOSIS — E039 Hypothyroidism, unspecified: Secondary | ICD-10-CM

## 2015-08-21 DIAGNOSIS — K5900 Constipation, unspecified: Secondary | ICD-10-CM | POA: Diagnosis not present

## 2015-08-21 DIAGNOSIS — R911 Solitary pulmonary nodule: Secondary | ICD-10-CM

## 2015-08-21 DIAGNOSIS — F0390 Unspecified dementia without behavioral disturbance: Secondary | ICD-10-CM

## 2015-08-21 DIAGNOSIS — C799 Secondary malignant neoplasm of unspecified site: Secondary | ICD-10-CM

## 2015-08-21 DIAGNOSIS — S72002S Fracture of unspecified part of neck of left femur, sequela: Secondary | ICD-10-CM | POA: Diagnosis not present

## 2015-08-21 DIAGNOSIS — C801 Malignant (primary) neoplasm, unspecified: Secondary | ICD-10-CM | POA: Diagnosis not present

## 2015-08-21 DIAGNOSIS — I10 Essential (primary) hypertension: Secondary | ICD-10-CM | POA: Diagnosis not present

## 2015-08-23 NOTE — Progress Notes (Addendum)
Patient ID: Janice Summers, female   DOB: 02/12/1927, 81 y.o.   MRN: [id number]    DATE:  08/21/15  MRN:  [id number]  BIRTHDAY: [birth year]  Facility:  Nursing Home Location:  [place] Room Number: 1008-P  LEVEL OF CARE:  SNF (31)  Contact Information    Name Relation Home Work [place] Son [phone]     Delon [place]   [phone]      Chief Complaint  Patient presents with  . Discharge Note    Left hip fracture S/P hemi-hip arthroplasty, lung nodule/metastatic disease, hypertension, dementia, hypothyroidism, constipation and anemia    HISTORY OF PRESENT ILLNESS:  This is an 81 year old female who is for discharge home with Home health PT, OT and ST. DME:  Rolling walker and 3-in-1 bedside commode. She has been admitted to [facility] on 08/14/15 from [facility]. She has PMH of thyroid disease and dementia. She fell at home and patient's son found her on the floor. She has dementia and does not remember any details about the fall. She sustained left hip fracture which she had hemi-hip arthroplasty on 08/11/15.   Patient was admitted to this facility for short-term rehabilitation after the patient's recent hospitalization.  Patient has completed SNF rehabilitation and therapy has cleared the patient for discharge.  PAST MEDICAL HISTORY:  Past Medical History  Diagnosis Date  . Thyroid disease   . Dementia     CURRENT MEDICATIONS: Reviewed    Medication List       This list is accurate as of: 08/21/15 11:59 PM.  Always use your most recent med list.               aspirin EC 325 MG tablet  Take 1 tablet (325 mg total) by mouth daily after breakfast. Take x 1 month post op to decrease risk of blood clots.     irbesartan 75 MG tablet  Commonly known as:  AVAPRO  Take 75 mg by mouth daily.     levothyroxine 112 MCG tablet  Commonly known as:  SYNTHROID, LEVOTHROID  Take 112 mcg by mouth daily before  breakfast.     senna 8.6 MG Tabs tablet  Commonly known as:  SENOKOT  Take 1 tablet (8.6 mg total) by mouth at bedtime as needed for mild constipation.     traMADol 50 MG tablet  Commonly known as:  ULTRAM  Take 1/2 tablet =25 mg  by mouth every 6 (six) hours as needed for moderate pain.         No Known Allergies   REVIEW OF SYSTEMS:  Unable to obtain due to dementia  PHYSICAL EXAMINATION  GENERAL APPEARANCE: Well nourished. In no acute distress. Normal body habitus SKIN:  Left anterior hip surgical incision has staples and dry dressing, no erythema HEAD: Normal in size and contour. No evidence of trauma EYES: Lids open and close normally. No blepharitis, entropion or ectropion. PERRL. Conjunctivae are clear and sclerae are white. Lenses are without opacity EARS: Pinnae are normal. Patient hears normal voice tunes of the examiner MOUTH and THROAT: Lips are without lesions. Oral mucosa is moist and without lesions. Tongue is normal in shape, size, and color and without lesions NECK: supple, trachea midline, no neck masses, no thyroid tenderness, no thyromegaly LYMPHATICS: no LAN in the neck, no supraclavicular LAN RESPIRATORY: breathing is even & unlabored, BS CTAB CARDIAC: RRR, no murmur,no extra heart sounds, no edema GI: abdomen soft,  normal BS, no masses, no tenderness, no hepatomegaly, no splenomegaly EXTREMITIES:  Able to move 4 extremities PSYCHIATRIC: Alert to name. Affect and behavior are appropriate  LABS/RADIOLOGY: Labs reviewed: Basic Metabolic Panel:  Recent Labs  08/12/15 0638 08/13/15 0550 08/14/15 0628 08/18/15  NA 135 135 137 138  K 4.7 4.5 4.7 4.4  CL 103 106 109  --   CO2 20* 18* 20*  --   GLUCOSE 145* 107* 108*  --   BUN 36* 43* 38* 36*  CREATININE 2.00* 1.80* 1.41* 1.4*  CALCIUM 9.3 8.6* 8.5*  --    CBC:  Recent Labs  08/11/15 0936 08/12/15 1220 08/13/15 0550 08/14/15 0628 08/18/15  WBC 10.4 11.0* 11.0* 11.5* 7.7  NEUTROABS 9.4*  --    --   --   --   HGB 12.0 9.3* 10.3* 9.1* 8.1*  HCT 35.5* 28.9* 31.5* 28.2* 26*  MCV 89.4 90.6 90.5 91.3  --   PLT 327 284 327 296 354   Cardiac Enzymes:  Recent Labs  08/11/15 0936  CKTOTAL 288*  TROPONINI <0.03     Dg Chest 1 View  08/11/2015  CLINICAL DATA:  Fall EXAM: CHEST 1 VIEW COMPARISON:  None. FINDINGS: Numerous lung nodules are present throughout both lungs. Most of the nodules are under 1 cm. Extrapleural mass in the right upper lobe related to a rib lesion with adjacent soft tissue mass. Heart size normal. Negative for heart failure. Negative for pneumonia or effusion. IMPRESSION: Widespread lung nodules consistent with metastatic disease. Rib lesion right third rib with adjacent soft tissue extrapleural mass. This is consistent with metastatic disease. Electronically Signed   By: Franchot Gallo M.D.   On: 08/11/2015 11:43   Ct Head Wo Contrast  08/11/2015  CLINICAL DATA:  Fall last night.  Found on floor EXAM: CT HEAD WITHOUT CONTRAST CT CERVICAL SPINE WITHOUT CONTRAST TECHNIQUE: Multidetector CT imaging of the head and cervical spine was performed following the standard protocol without intravenous contrast. Multiplanar CT image reconstructions of the cervical spine were also generated. COMPARISON:  03/09/2015 FINDINGS: CT HEAD FINDINGS Generalized atrophy with mild chronic microvascular ischemic change in the white matter. Negative for acute infarct.  Negative for hemorrhage or mass Negative for skull fracture. CT CERVICAL SPINE FINDINGS Mild anterior slip C4-5 with disc and facet degeneration. Moderate disc degeneration and spondylosis C5-6 and C6-7. Negative for fracture.  No bony mass lesion. Carotid artery calcification. Numerous lung nodules bilaterally, concerning for metastatic disease. IMPRESSION: Atrophy and chronic microvascular ischemia. No acute intracranial abnormality Cervical spine degenerative change.  Negative for fracture Numerous lung nodules, consistent with  metastatic disease. Electronically Signed   By: Franchot Gallo M.D.   On: 08/11/2015 11:27   Ct Cervical Spine Wo Contrast  08/11/2015  CLINICAL DATA:  Fall last night.  Found on floor EXAM: CT HEAD WITHOUT CONTRAST CT CERVICAL SPINE WITHOUT CONTRAST TECHNIQUE: Multidetector CT imaging of the head and cervical spine was performed following the standard protocol without intravenous contrast. Multiplanar CT image reconstructions of the cervical spine were also generated. COMPARISON:  03/09/2015 FINDINGS: CT HEAD FINDINGS Generalized atrophy with mild chronic microvascular ischemic change in the white matter. Negative for acute infarct.  Negative for hemorrhage or mass Negative for skull fracture. CT CERVICAL SPINE FINDINGS Mild anterior slip C4-5 with disc and facet degeneration. Moderate disc degeneration and spondylosis C5-6 and C6-7. Negative for fracture.  No bony mass lesion. Carotid artery calcification. Numerous lung nodules bilaterally, concerning for metastatic disease. IMPRESSION: Atrophy and  chronic microvascular ischemia. No acute intracranial abnormality Cervical spine degenerative change.  Negative for fracture Numerous lung nodules, consistent with metastatic disease. Electronically Signed   By: Franchot Gallo M.D.   On: 08/11/2015 11:27   Dg Hip Operative Unilat With Pelvis Left  08/12/2015  CLINICAL DATA:  Status post left hip joint replacement following an acute fracture EXAM: OPERATIVE left HIP (WITH PELVIS IF PERFORMED) 2 VIEWS TECHNIQUE: Fluoroscopic spot image(s) were submitted for interpretation post-operatively. Reported fluoro time is 12 seconds COMPARISON:  Preoperative images of the left hip of August 11, 2015 FINDINGS: The patient has undergone placement of a left hip prosthesis for a subcapital fracture. Radiographic positioning of the prosthetic components is good. The interface with the native bone appears normal. IMPRESSION: The patient has undergone left hip joint replacement  without evidence of immediate postprocedure complication. Electronically Signed   By: David  Martinique M.D.   On: 08/12/2015 06:59   Dg Hip Unilat With Pelvis 2-3 Views Left  08/11/2015  CLINICAL DATA:  Pain following fall EXAM: DG HIP (WITH OR WITHOUT PELVIS) 2-3V LEFT COMPARISON:  None. FINDINGS: Frontal pelvis as well as frontal and lateral left hip images were obtained. There is a subcapital femoral neck fracture on the left with varus angulation at fracture site. There is mild superior migration of the left femoral shaft. No other fractures. No dislocation. There is mild symmetric narrowing of both hip joints. Bones appear somewhat osteoporotic. IMPRESSION: Subcapital femoral neck fracture on the left with superior migration of the femoral shaft and varus angulation of the fracture site. No dislocation. Bones osteoporotic. Mild symmetric narrowing both hip joints. Electronically Signed   By: Lowella Grip III M.D.   On: 08/11/2015 11:47    ASSESSMENT/PLAN:  Left hip fracture S/P hyperlipidemia hip arthroplasty - for home health PT, OT and ST; LLE WBAT; continue aspirin 325 mg 1 tab by mouth twice a day for DVT prophylaxis and tramadol 25mg  by mouth every 6 hours when necessary pain; follow-up with Dr. Berenice Primas, orthopedic surgeon, on 08/27/15  Lung nodule/metastatic disease - also have a rib lesion; no plans to pursue work up; palliative medicine was consulted, DO NOT RESUSCITATE and MOST form filled  Hypertension - continue Avapro 75 mg 1 tab by mouth daily  Chronic kidney disease, stage III - recheck creatinine 1.4  Dementia - stable  Hypothyroidism - continue Synthroid 112 g 1 tab by mouth daily  Constipation - continue Senokot 1 tab by mouth daily at bedtime when necessary  Anemia, acute blood loss  - recheck hemoglobin 8.0      I have filled out patient's discharge paperwork and written prescriptions.  Patient will receive home health PT, OT and ST.  DME provided:  Rolling  walker and 3 in 1 bedside commode  Total discharge time: Greater than 30 minutes  Discharge time involved coordination of the discharge process with social worker, nursing staff and therapy department. Medical justification for home health services/DME verified.    [facility], NP [employer] [phone]

## 2015-08-24 NOTE — Progress Notes (Signed)
Patient ID: Janice Summers, female   DOB: [birth year], 81 y.o.   MRN: [id number]  DATE: 08/17/15  MRN: [id number]  BIRTHDAY: July 27, 1927  Facility: Nursing Home Location: [place] Room Number: 1008-P  LEVEL OF CARE: SNF (31)  Contact Information    Name Relation Home Work [place] Son [phone]     Delon [place]   [phone]      Chief Complaint  Patient presents with  . Discharge Note    Left hip fracture S/P hemi-hip arthroplasty, lung nodule/metastatic disease, hypertension, dementia, hypothyroidism, constipation and anemia    HISTORY OF PRESENT ILLNESS: This is an 81 year old female who was been admitted to [facility] on 08/14/15 from [facility]. She has PMH of thyroid disease and dementia. She fell at home and patient's son found her on the floor. She has dementia and does not remember any details about the fall. She sustained left hip fracture which she had hemi-hip arthroplasty on 08/11/15. She has been admitted for a short-term rehabilitation.   PAST MEDICAL HISTORY:  Past Medical History  Diagnosis Date  . Thyroid disease   . Dementia      CURRENT MEDICATIONS: Reviewed  Patient's Medications  New Prescriptions   No medications on file  Previous Medications   ASPIRIN EC 325 MG TABLET  Take 1 tablet (325 mg total) by mouth daily after breakfast. Take x 1 month post op to decrease risk of blood clots.   IRBESARTAN (AVAPRO) 75 MG TABLET  Take 75 mg by mouth daily.   LEVOTHYROXINE (SYNTHROID, LEVOTHROID) 112 MCG TABLET  Take 112 mcg by mouth daily before breakfast.   SENNA (SENOKOT) 8.6 MG TABS TABLET  Take 1 tablet (8.6 mg total) by mouth at bedtime as needed for mild constipation.   TRAMADOL (ULTRAM) 50 MG TABLET  Take 1-2 tablets (50-100 mg total) by mouth every 6 (six) hours as needed for moderate pain.  Modified Medications    No medications on file  Discontinued Medications   No medications on file     No Known Allergies   REVIEW OF SYSTEMS: Unable to obtain due to dementia  PHYSICAL EXAMINATION  GENERAL APPEARANCE: Well nourished. In no acute distress. Normal body habitus SKIN: Left anterior hip surgical incision has staples and dry dressing, no erythema HEAD: Normal in size and contour. No evidence of trauma EYES: Lids open and close normally. No blepharitis, entropion or ectropion. PERRL. Conjunctivae are clear and sclerae are white. Lenses are without opacity EARS: Pinnae are normal. Patient hears normal voice tunes of the examiner MOUTH and THROAT: Lips are without lesions. Oral mucosa is moist and without lesions. Tongue is normal in shape, size, and color and without lesions NECK: supple, trachea midline, no neck masses, no thyroid tenderness, no thyromegaly LYMPHATICS: no LAN in the neck, no supraclavicular LAN RESPIRATORY: breathing is even & unlabored, BS CTAB CARDIAC: RRR, no murmur,no extra heart sounds, no edema GI: abdomen soft, normal BS, no masses, no tenderness, no hepatomegaly, no splenomegaly EXTREMITIES: Able to move 4 extremities PSYCHIATRIC: Alert to name. Affect and behavior are appropriate  LABS/RADIOLOGY: Labs reviewed: Basic Metabolic Panel: Liver Function Tests: Recent Labs  08/12/15 0638 08/13/15 0550 08/14/15 0628   NA 135 135 137   K 4.7 4.5 4.7   CL 103 106 109   CO2 20* 18* 20*   GLUCOSE 145* 107* 108*   BUN 36* 43* 38*   CREATININE 2.00* 1.80* 1.41*   CALCIUM  9.3 8.6* 8.5*    CBC:  Recent Labs  08/11/15 0936 08/12/15 1220 08/13/15 0550 08/14/15 0628  WBC 10.4 11.0* 11.0* 11.5*  NEUTROABS 9.4* --  --  --   HGB 12.0 9.3* 10.3* 9.1*  HCT 35.5* 28.9* 31.5* 28.2*  MCV 89.4 90.6 90.5 91.3  PLT 327 284 327 296   Cardiac Enzymes:  Recent Labs (within last 365  days)     Recent Labs  08/11/15 0936  CKTOTAL 288*  TROPONINI <0.03       Imaging Results    Dg Chest 1 View  08/11/2015 CLINICAL DATA: Fall EXAM: CHEST 1 VIEW COMPARISON: None. FINDINGS: Numerous lung nodules are present throughout both lungs. Most of the nodules are under 1 cm. Extrapleural mass in the right upper lobe related to a rib lesion with adjacent soft tissue mass. Heart size normal. Negative for heart failure. Negative for pneumonia or effusion. IMPRESSION: Widespread lung nodules consistent with metastatic disease. Rib lesion right third rib with adjacent soft tissue extrapleural mass. This is consistent with metastatic disease. Electronically Signed By: Franchot Gallo M.D. On: 08/11/2015 11:43   Ct Head Wo Contrast  08/11/2015 CLINICAL DATA: Fall last night. Found on floor EXAM: CT HEAD WITHOUT CONTRAST CT CERVICAL SPINE WITHOUT CONTRAST TECHNIQUE: Multidetector CT imaging of the head and cervical spine was performed following the standard protocol without intravenous contrast. Multiplanar CT image reconstructions of the cervical spine were also generated. COMPARISON: 03/09/2015 FINDINGS: CT HEAD FINDINGS Generalized atrophy with mild chronic microvascular ischemic change in the white matter. Negative for acute infarct. Negative for hemorrhage or mass Negative for skull fracture. CT CERVICAL SPINE FINDINGS Mild anterior slip C4-5 with disc and facet degeneration. Moderate disc degeneration and spondylosis C5-6 and C6-7. Negative for fracture. No bony mass lesion. Carotid artery calcification. Numerous lung nodules bilaterally, concerning for metastatic disease. IMPRESSION: Atrophy and chronic microvascular ischemia. No acute intracranial abnormality Cervical spine degenerative change. Negative for fracture Numerous lung nodules, consistent with metastatic disease. Electronically Signed By: Franchot Gallo M.D. On: 08/11/2015 11:27   Ct Cervical Spine Wo  Contrast  08/11/2015 CLINICAL DATA: Fall last night. Found on floor EXAM: CT HEAD WITHOUT CONTRAST CT CERVICAL SPINE WITHOUT CONTRAST TECHNIQUE: Multidetector CT imaging of the head and cervical spine was performed following the standard protocol without intravenous contrast. Multiplanar CT image reconstructions of the cervical spine were also generated. COMPARISON: 03/09/2015 FINDINGS: CT HEAD FINDINGS Generalized atrophy with mild chronic microvascular ischemic change in the white matter. Negative for acute infarct. Negative for hemorrhage or mass Negative for skull fracture. CT CERVICAL SPINE FINDINGS Mild anterior slip C4-5 with disc and facet degeneration. Moderate disc degeneration and spondylosis C5-6 and C6-7. Negative for fracture. No bony mass lesion. Carotid artery calcification. Numerous lung nodules bilaterally, concerning for metastatic disease. IMPRESSION: Atrophy and chronic microvascular ischemia. No acute intracranial abnormality Cervical spine degenerative change. Negative for fracture Numerous lung nodules, consistent with metastatic disease. Electronically Signed By: Franchot Gallo M.D. On: 08/11/2015 11:27   Dg Hip Operative Unilat With Pelvis Left  08/12/2015 CLINICAL DATA: Status post left hip joint replacement following an acute fracture EXAM: OPERATIVE left HIP (WITH PELVIS IF PERFORMED) 2 VIEWS TECHNIQUE: Fluoroscopic spot image(s) were submitted for interpretation post-operatively. Reported fluoro time is 12 seconds COMPARISON: Preoperative images of the left hip of August 11, 2015 FINDINGS: The patient has undergone placement of a left hip prosthesis for a subcapital fracture. Radiographic positioning of the prosthetic components is good. The interface with the native bone appears  normal. IMPRESSION: The patient has undergone left hip joint replacement without evidence of immediate postprocedure complication. Electronically Signed By: David Martinique M.D. On:  08/12/2015 06:59   Dg Hip Unilat With Pelvis 2-3 Views Left  08/11/2015 CLINICAL DATA: Pain following fall EXAM: DG HIP (WITH OR WITHOUT PELVIS) 2-3V LEFT COMPARISON: None. FINDINGS: Frontal pelvis as well as frontal and lateral left hip images were obtained. There is a subcapital femoral neck fracture on the left with varus angulation at fracture site. There is mild superior migration of the left femoral shaft. No other fractures. No dislocation. There is mild symmetric narrowing of both hip joints. Bones appear somewhat osteoporotic. IMPRESSION: Subcapital femoral neck fracture on the left with superior migration of the femoral shaft and varus angulation of the fracture site. No dislocation. Bones osteoporotic. Mild symmetric narrowing both hip joints. Electronically Signed By: Lowella Grip III M.D. On: 08/11/2015 11:47     ASSESSMENT/PLAN:  Left hip fracture S/P hyperlipidemia hip arthroplasty - for rehabilitation; LLE WBAT; continue aspirin 325 mg 1 tab by mouth twice a day for DVT prophylaxis and tramadol 50 mg 1-2 tabs by mouth every 6 hours when necessary pain; follow-up with Dr. Berenice Primas, orthopedic surgeon, on 08/27/15  Lung nodule/metastatic disease - also have a rib lesion; no plans to pursue work up; palliative medicine was consulted, DO NOT RESUSCITATE and MOST form filled  Hypertension - continue Avapro 75 mg 1 tab by mouth daily  Chronic kidney disease, stage III - creatinine 1.4; check BMP  Dementia - stable  Hypothyroidism - continue Synthroid 112 g 1 tab by mouth daily  Constipation - continue Senokot 1 tab by mouth daily at bedtime when necessary  Anemia, acute blood loss - hemoglobin 8.0; check CBC     Goals of care: Short-term rehabilitation   [facility], NP [facility] [phone]

## 2015-08-24 NOTE — Addendum Note (Signed)
Addended by: Durenda Age C on: 08/24/2015 12:24 AM   Modules accepted: Medications, Level of Service

## 2015-10-14 DEATH — deceased

## 2017-04-07 IMAGING — CT CT HEAD W/O CM
1 series · 16 of 29 positions shown, 20 images · non-contrast
Comparison: None.

CLINICAL DATA: Progressive memory loss past 6 months

EXAM:
CT HEAD WITHOUT CONTRAST
TECHNIQUE: Contiguous axial images were obtained from the base of the skull
through the vertex without intravenous contrast.

[Series 5: head wo 5.0 h31s · axial · 0.39mm/px · z∈[-110,+20]mm · 16 of 29 slices shown, 20 images]
[im 2/29  brain]
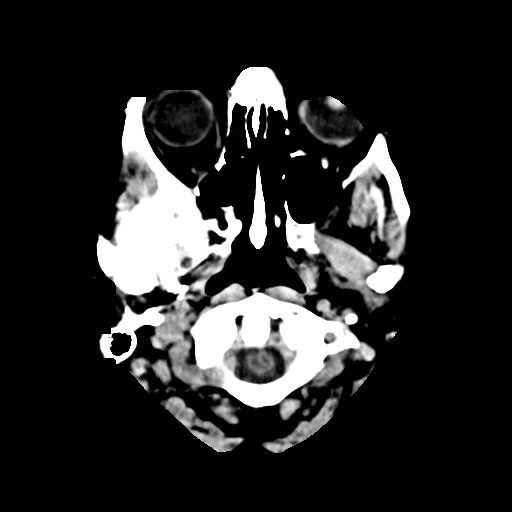
[im 2/29  bone]
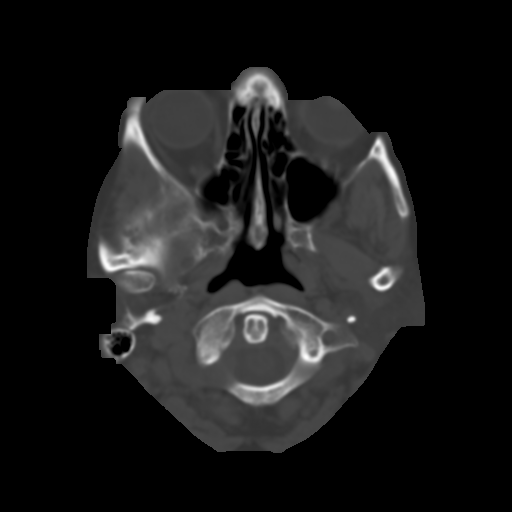
[im 4/29  brain]
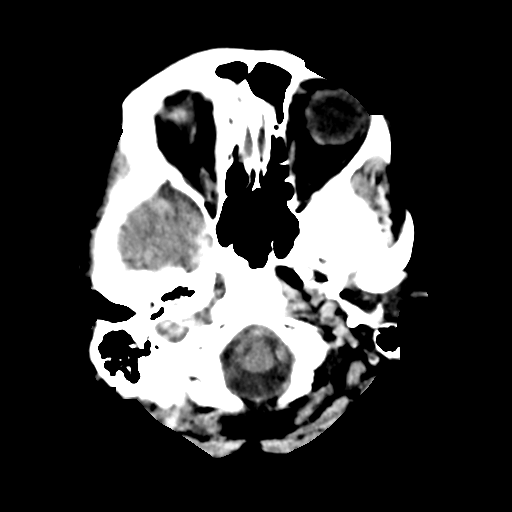
[im 6/29  brain]
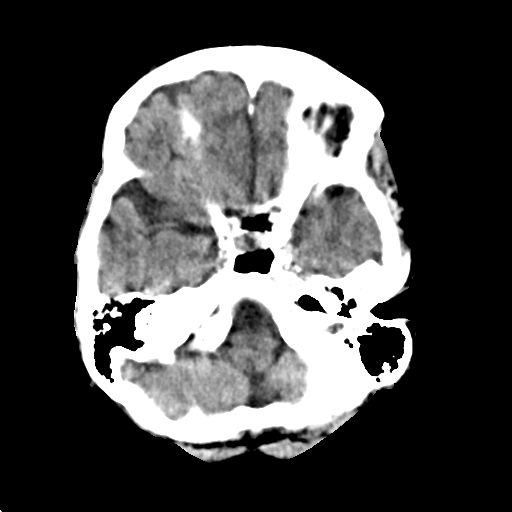
[im 7/29  brain]
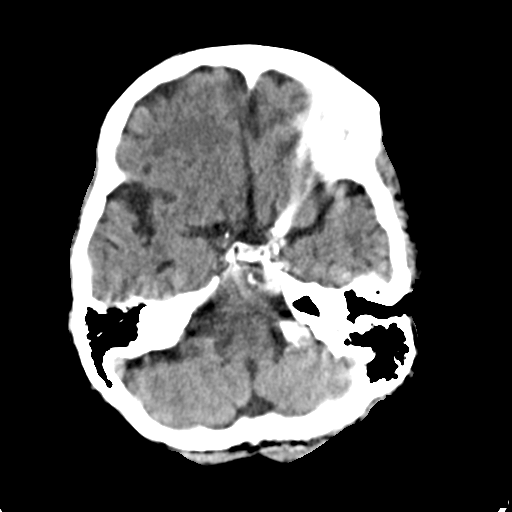
[im 9/29  brain]
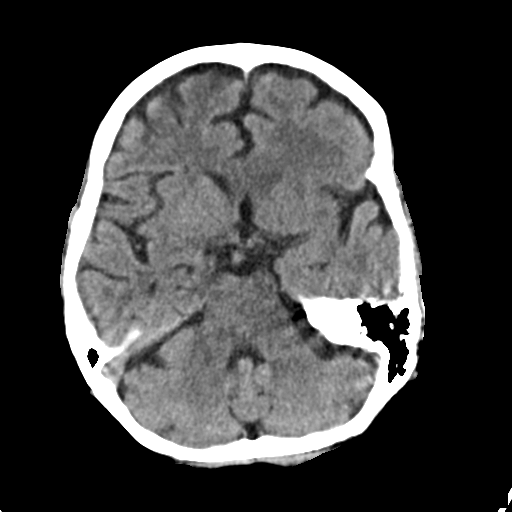
[im 9/29  bone]
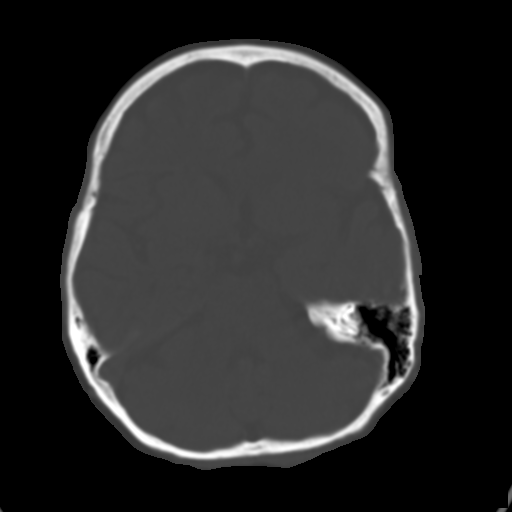
[im 11/29  brain]
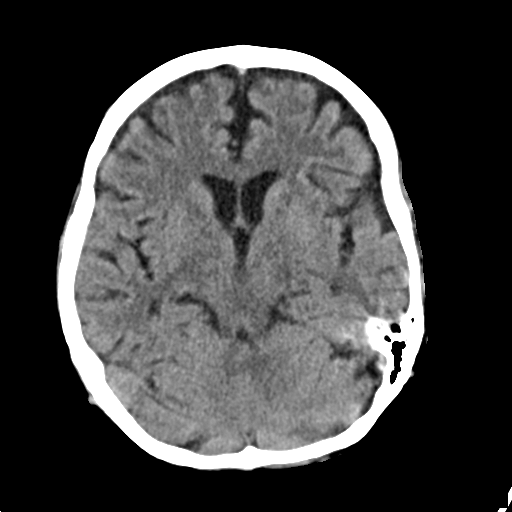
[im 12/29  brain]
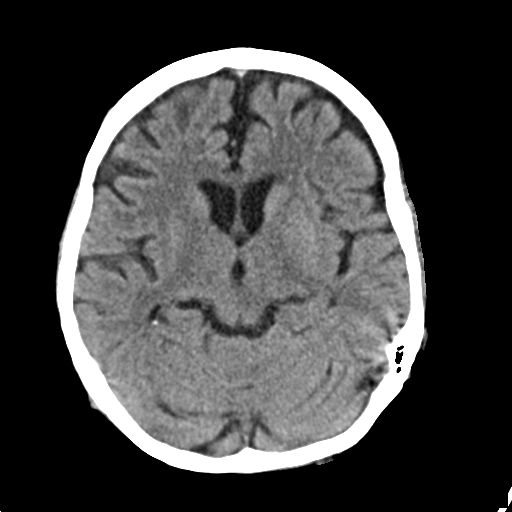
[im 14/29  brain]
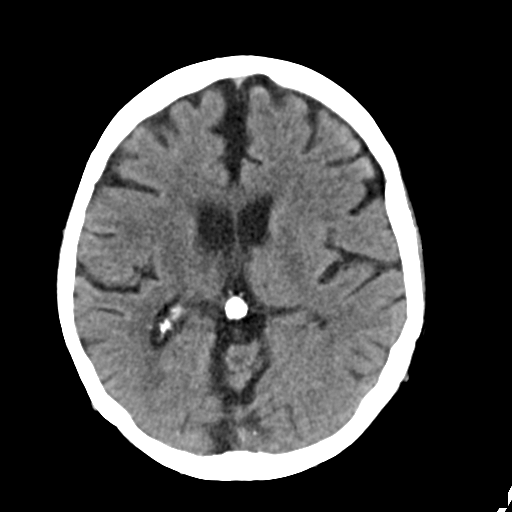
[im 16/29  brain]
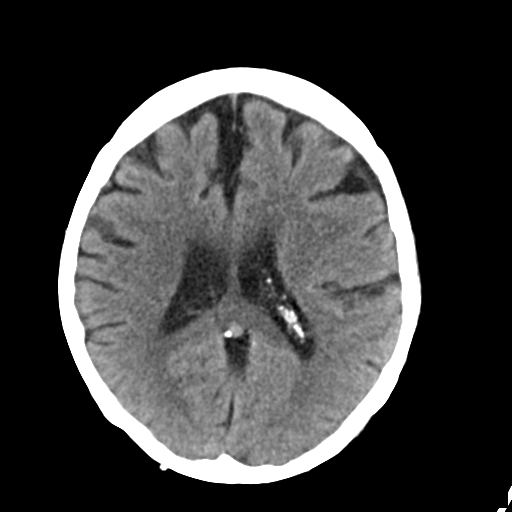
[im 16/29  bone]
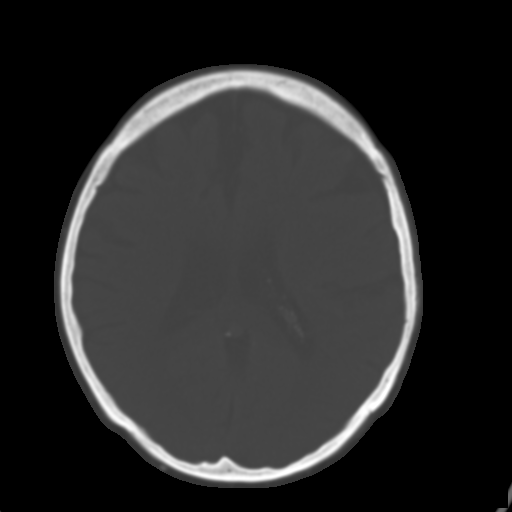
[im 18/29  brain]
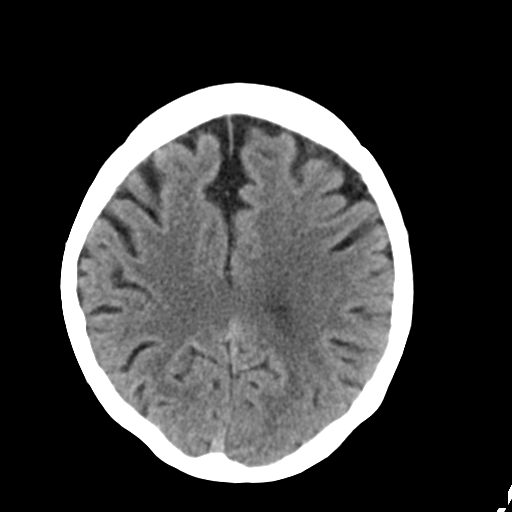
[im 19/29  brain]
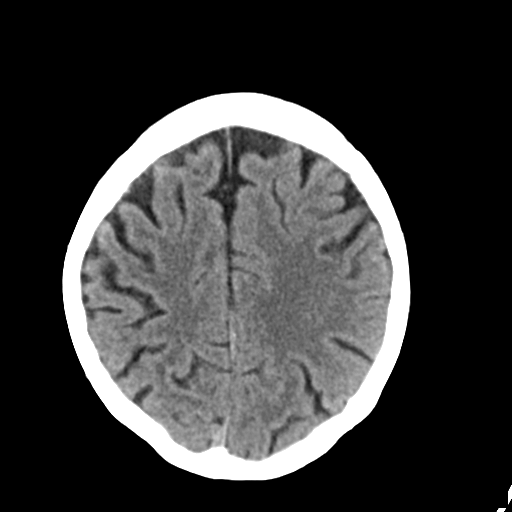
[im 21/29  brain]
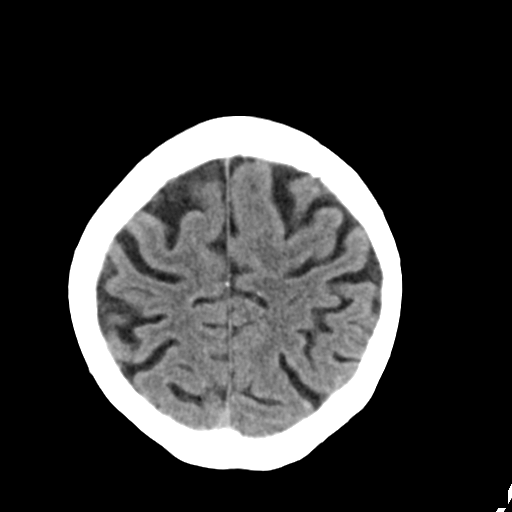
[im 23/29  brain]
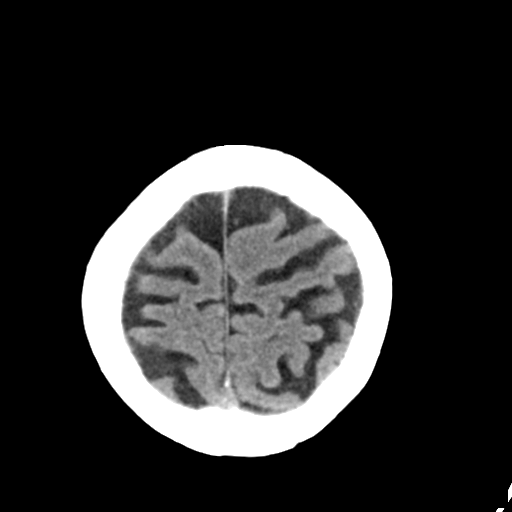
[im 23/29  bone]
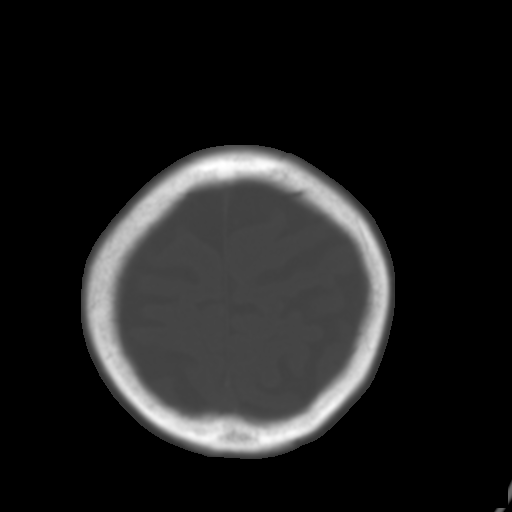
[im 24/29  brain]
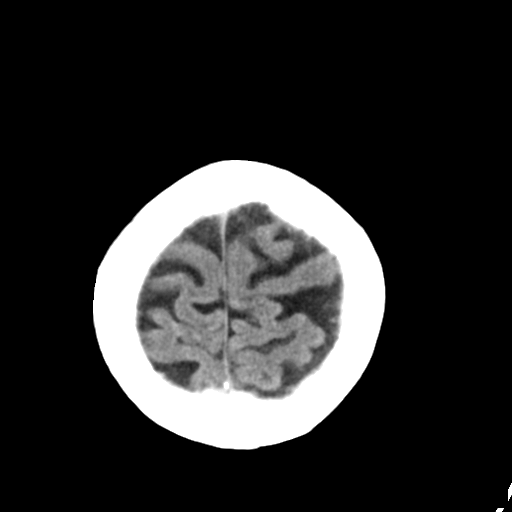
[im 26/29  brain]
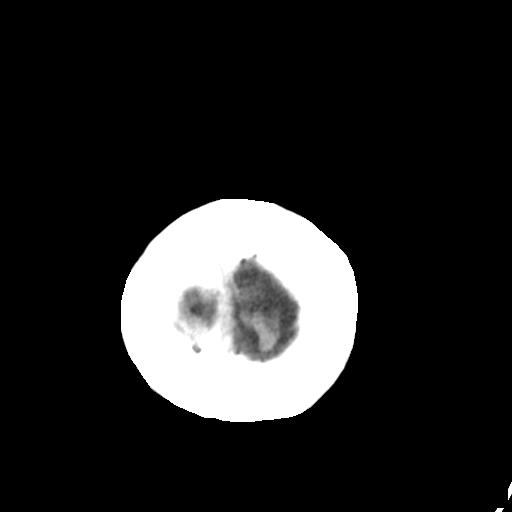
[im 28/29  brain]
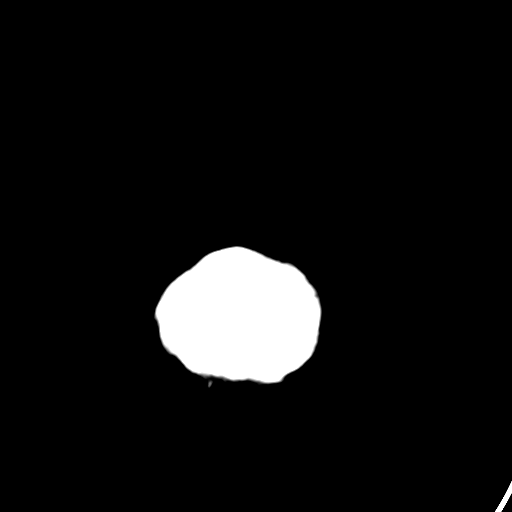

[16 of 29 positions shown; findings below may reference images not displayed]

FINDINGS: There is moderate diffuse atrophy. There is no intracranial mass,
hemorrhage, extra-axial fluid collection, or midline shift.
Gray-white compartments appear within normal limits. No evidence of
acute infarct. The bony calvarium appears intact. The mastoid air
cells are clear.
IMPRESSION: Moderate generalized atrophy. No intracranial mass, hemorrhage, or
focal gray lesions/appearing infarct.

## 2017-09-09 IMAGING — CT CT HEAD W/O CM
5 of 6 series · 16 of 47 positions shown, 17 images · non-contrast
Comparison: 03/09/2015

CLINICAL DATA: Fall last night.  Found on floor

EXAM:
CT HEAD WITHOUT CONTRAST
CT CERVICAL SPINE WITHOUT CONTRAST
TECHNIQUE: Multidetector CT imaging of the head and cervical spine was
performed following the standard protocol without intravenous
contrast. Multiplanar CT image reconstructions of the cervical spine
were also generated.

[Series 3: head without · axial · non-contrast · 0.40mm/px · z∈[-51,+84]mm · 3 of 28 slices shown, 4 images]
[im 1/28  brain]
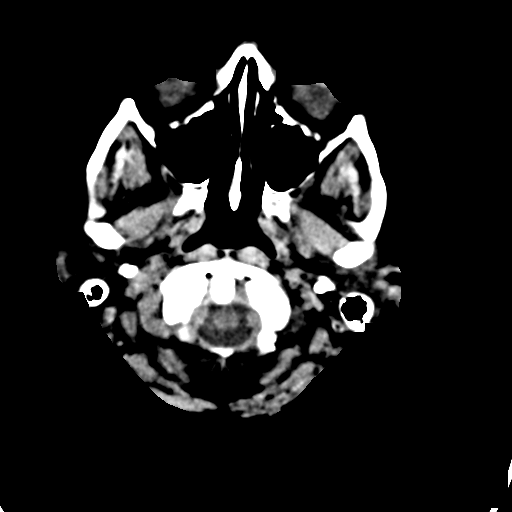
[im 1/28  bone]
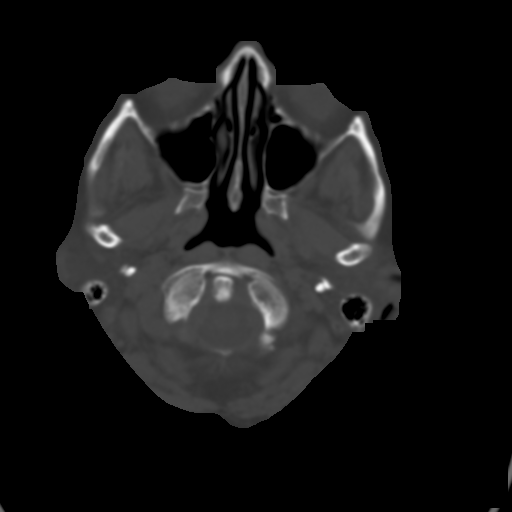
[im 14/28  brain]
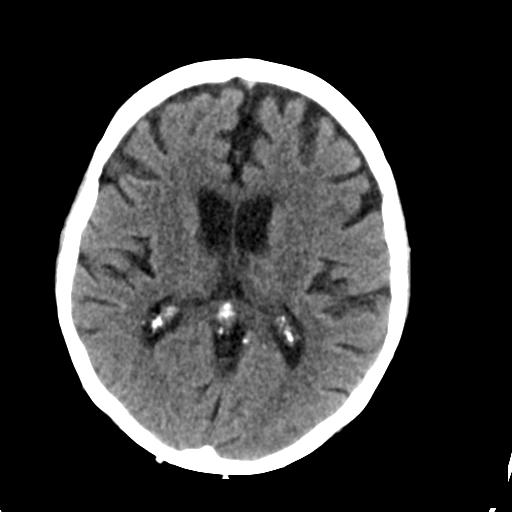
[im 28/28  brain]
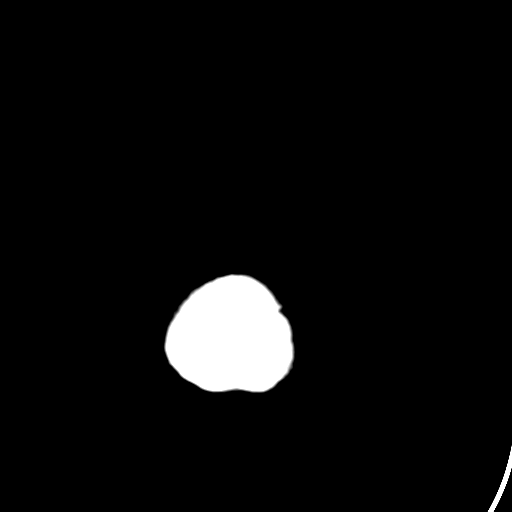

[Series 4: head bone · axial · 0.40mm/px · z∈[-29,+63]mm · 5 of 70 slices shown]
[im 12/70  bone]
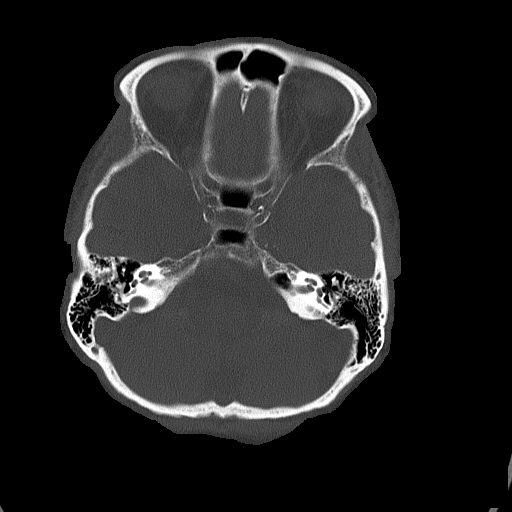
[im 24/70  bone]
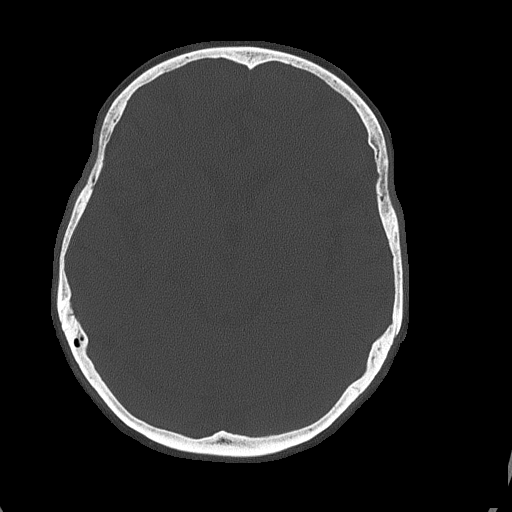
[im 35/70  bone]
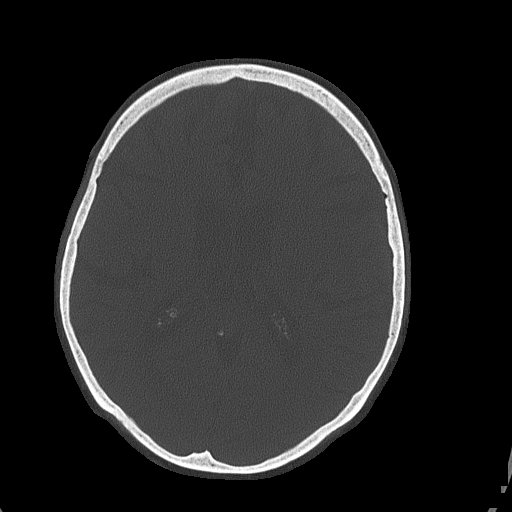
[im 47/70  bone]
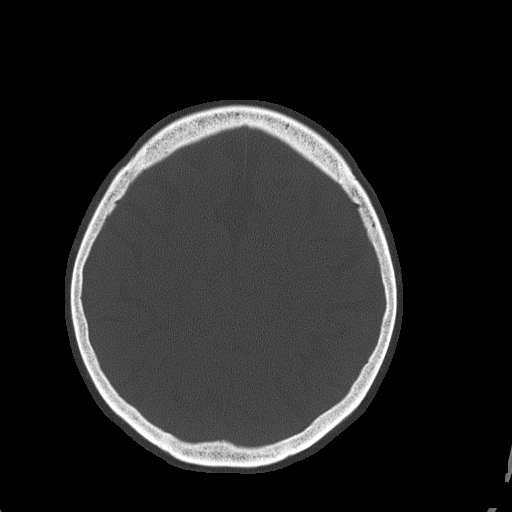
[im 58/70  bone]
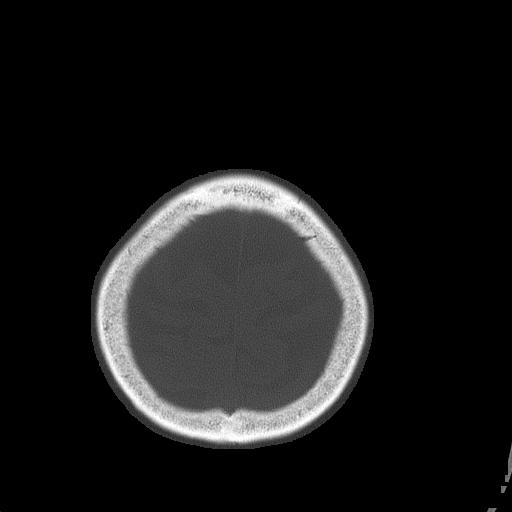

[Series 5: c_spine 2.0 st · axial · 0.27mm/px · z∈[-208,-188]mm · 2 of 94 slices shown]
[im 11/94  brain]
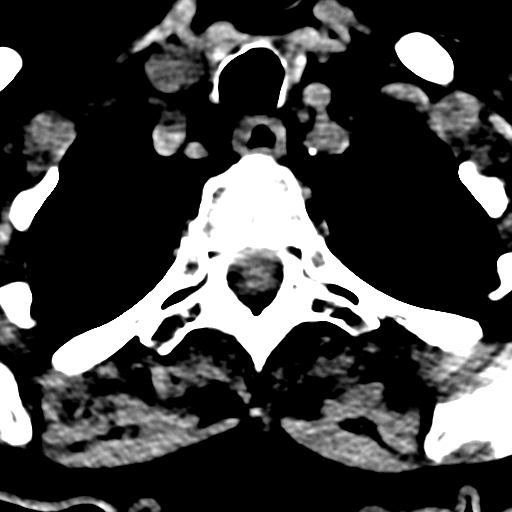
[im 21/94  brain]
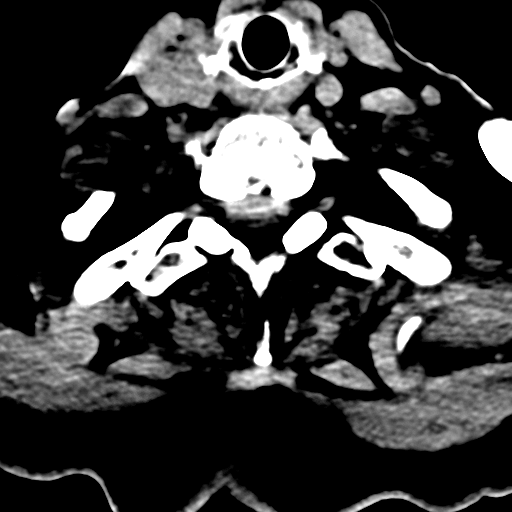

[Series 7: c_spine 2.0 sag bone · sagittal · 0.32mm/px · 3 of 45 slices shown]
[im 15/45  brain]
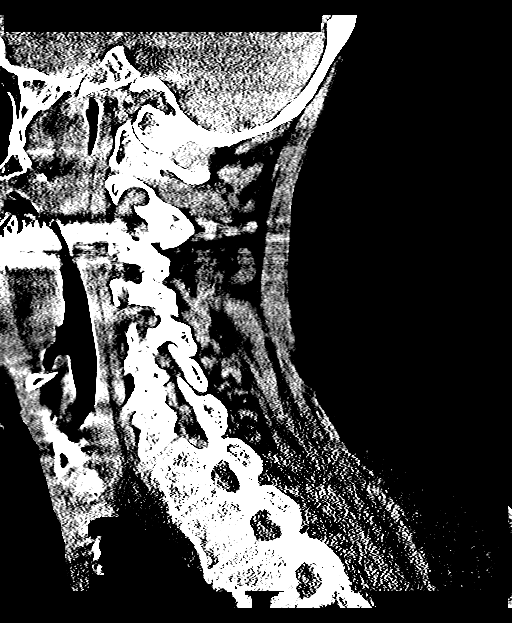
[im 23/45  brain]
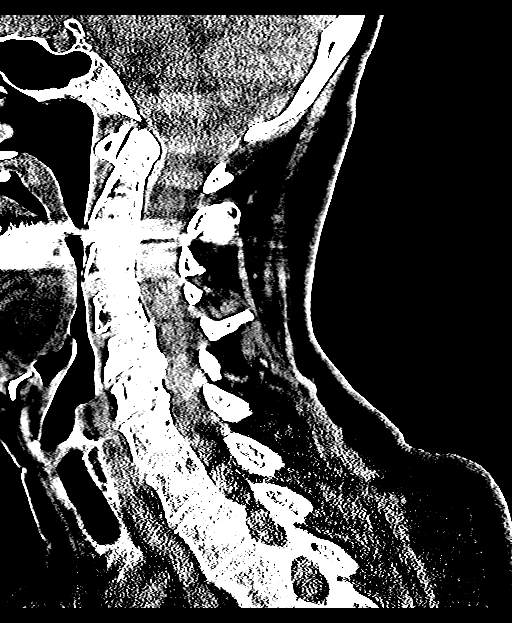
[im 30/45  brain]
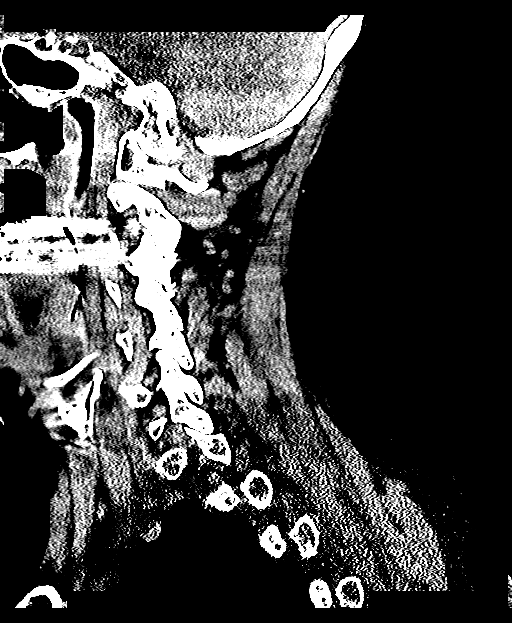

[Series 8: c_spine 2.0 cor bone · coronal · 0.26mm/px · 3 of 51 slices shown]
[im 17/51  brain]
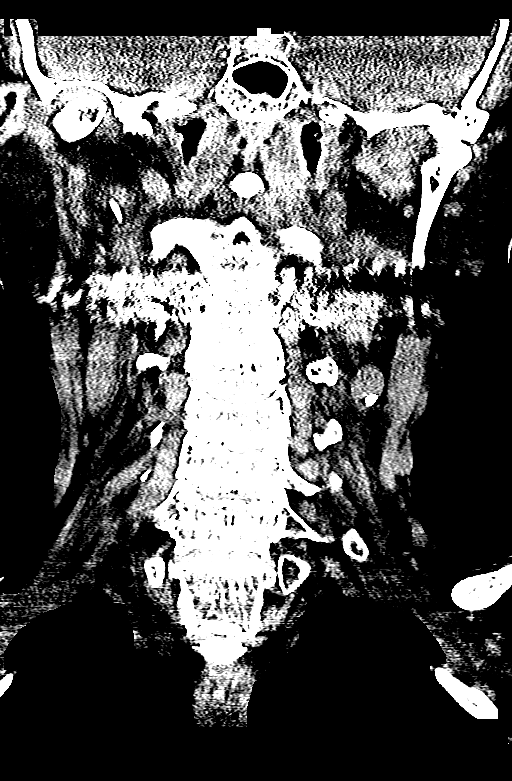
[im 23/51  brain]
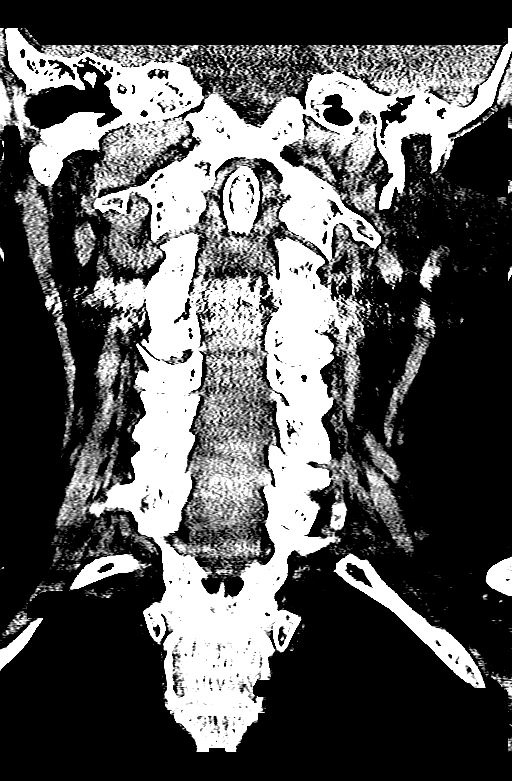
[im 28/51  brain]
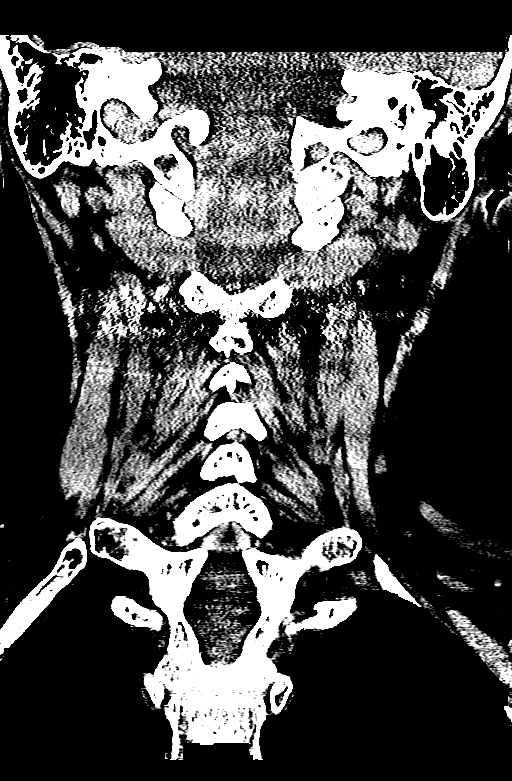

[16 of 47 positions shown; findings below may reference images not displayed]

FINDINGS: CT HEAD FINDINGS

Generalized atrophy with mild chronic microvascular ischemic change
in the white matter.

Negative for acute infarct.  Negative for hemorrhage or mass

Negative for skull fracture.

CT CERVICAL SPINE FINDINGS

Mild anterior slip C4-5 with disc and facet degeneration. Moderate
disc degeneration and spondylosis C5-6 and C6-7.

Negative for fracture.  No bony mass lesion.

Carotid artery calcification.

Numerous lung nodules bilaterally, concerning for metastatic
disease.
IMPRESSION: Atrophy and chronic microvascular ischemia. No acute intracranial
abnormality

Cervical spine degenerative change.  Negative for fracture

Numerous lung nodules, consistent with metastatic disease.
